# Patient Record
Sex: Female | Born: 1980 | Race: White | Hispanic: No | Marital: Married | Smoking: Never smoker
Health system: Southern US, Community
[De-identification: ages and names within clinical notes are randomized; demographics above are authoritative.]

## PROBLEM LIST (undated history)

## (undated) ENCOUNTER — Inpatient Hospital Stay (HOSPITAL_COMMUNITY): Payer: Self-pay

## (undated) DIAGNOSIS — Z789 Other specified health status: Secondary | ICD-10-CM

## (undated) DIAGNOSIS — J301 Allergic rhinitis due to pollen: Secondary | ICD-10-CM

## (undated) DIAGNOSIS — B019 Varicella without complication: Secondary | ICD-10-CM

## (undated) DIAGNOSIS — K219 Gastro-esophageal reflux disease without esophagitis: Secondary | ICD-10-CM

## (undated) DIAGNOSIS — Z6711 Type A blood, Rh negative: Secondary | ICD-10-CM

## (undated) HISTORY — DX: Varicella without complication: B01.9

## (undated) HISTORY — DX: Gastro-esophageal reflux disease without esophagitis: K21.9

## (undated) HISTORY — PX: MOUTH SURGERY: SHX715

## (undated) HISTORY — DX: Type A blood, Rh negative: Z67.11

## (undated) HISTORY — DX: Allergic rhinitis due to pollen: J30.1

## (undated) HISTORY — PX: LAPAROSCOPY: SHX197

---
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
## 2007-10-29 DIAGNOSIS — B977 Papillomavirus as the cause of diseases classified elsewhere: Secondary | ICD-10-CM

## 2007-10-29 HISTORY — PX: CERVICAL CONE BIOPSY: SUR198

## 2007-10-29 HISTORY — PX: LEEP: SHX91

## 2007-10-29 HISTORY — DX: Papillomavirus as the cause of diseases classified elsewhere: B97.7

## 2011-03-08 ENCOUNTER — Emergency Department (HOSPITAL_COMMUNITY)
Admission: EM | Admit: 2011-03-08 | Discharge: 2011-03-09 | Disposition: A | Discharge status: Home / Self Care | Payer: No Typology Code available for payment source | Attending: Emergency Medicine | Admitting: Emergency Medicine

## 2011-03-08 DIAGNOSIS — M542 Cervicalgia: Secondary | ICD-10-CM | POA: Insufficient documentation

## 2011-03-08 DIAGNOSIS — S139XXA Sprain of joints and ligaments of unspecified parts of neck, initial encounter: Secondary | ICD-10-CM | POA: Insufficient documentation

## 2011-03-08 DIAGNOSIS — S335XXA Sprain of ligaments of lumbar spine, initial encounter: Secondary | ICD-10-CM | POA: Insufficient documentation

## 2013-09-07 LAB — OB RESULTS CONSOLE GC/CHLAMYDIA
Chlamydia: NEGATIVE
Gonorrhea: NEGATIVE

## 2013-09-29 LAB — OB RESULTS CONSOLE ABO/RH: RH TYPE: NEGATIVE

## 2013-09-29 LAB — OB RESULTS CONSOLE RUBELLA ANTIBODY, IGM: Rubella: IMMUNE

## 2013-09-29 LAB — OB RESULTS CONSOLE RPR: RPR: NONREACTIVE

## 2013-09-29 LAB — OB RESULTS CONSOLE HIV ANTIBODY (ROUTINE TESTING): HIV: NONREACTIVE

## 2013-09-29 LAB — OB RESULTS CONSOLE HEPATITIS B SURFACE ANTIGEN: HEP B S AG: NEGATIVE

## 2013-09-29 LAB — OB RESULTS CONSOLE ANTIBODY SCREEN: Antibody Screen: NEGATIVE

## 2013-10-28 NOTE — L&D Delivery Note (Signed)
Delivery Note At 8:11 PM a viable and healthy female was delivered via Vaginal, Spontaneous Delivery (Presentation: Left ; Occiput Anterior).  APGAR: 8, 9; weight pending.   Placenta status: Intact, Spontaneous.  Cord: 3 vessels  Anesthesia: Epidural  Episiotomy: None Lacerations: None Suture Repair: NA Est. Blood Loss (mL): 300  Mom to postpartum.  Baby to Couplet care / Skin to Skin.  Sheri Bray,Sheri Bray. 04/25/2014, 8:53 PM

## 2013-12-23 ENCOUNTER — Inpatient Hospital Stay (HOSPITAL_COMMUNITY)
Admission: AD | Admit: 2013-12-23 | Payer: No Typology Code available for payment source | Source: Ambulatory Visit | Admitting: Obstetrics and Gynecology

## 2014-02-02 ENCOUNTER — Ambulatory Visit (INDEPENDENT_AMBULATORY_CARE_PROVIDER_SITE_OTHER): Payer: Self-pay | Admitting: Pediatrics

## 2014-02-02 DIAGNOSIS — Z7681 Expectant parent(s) prebirth pediatrician visit: Secondary | ICD-10-CM

## 2014-02-02 NOTE — Progress Notes (Signed)
Prenatal visit with expectant parents Met mother and father, expecting girl infant on April 29, 2014 Dr. 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

## 2014-03-31 LAB — OB RESULTS CONSOLE GBS: GBS: NEGATIVE

## 2014-04-21 ENCOUNTER — Inpatient Hospital Stay (HOSPITAL_COMMUNITY)
Admission: AD | Admit: 2014-04-21 | Discharge: 2014-04-21 | Disposition: A | Discharge status: Home / Self Care | Payer: BC Managed Care – PPO | Source: Ambulatory Visit | Attending: Obstetrics and Gynecology | Admitting: Obstetrics and Gynecology

## 2014-04-21 ENCOUNTER — Encounter (HOSPITAL_COMMUNITY): Payer: Self-pay | Admitting: *Deleted

## 2014-04-21 DIAGNOSIS — O9989 Other specified diseases and conditions complicating pregnancy, childbirth and the puerperium: Secondary | ICD-10-CM

## 2014-04-21 DIAGNOSIS — Z87891 Personal history of nicotine dependence: Secondary | ICD-10-CM | POA: Insufficient documentation

## 2014-04-21 DIAGNOSIS — O99891 Other specified diseases and conditions complicating pregnancy: Secondary | ICD-10-CM | POA: Insufficient documentation

## 2014-04-21 DIAGNOSIS — N898 Other specified noninflammatory disorders of vagina: Secondary | ICD-10-CM | POA: Insufficient documentation

## 2014-04-21 DIAGNOSIS — O26899 Other specified pregnancy related conditions, unspecified trimester: Secondary | ICD-10-CM

## 2014-04-21 HISTORY — DX: Other specified health status: Z78.9

## 2014-04-21 NOTE — MAU Note (Signed)
Pt states she noticed discharge today at 1230, pt denies pain at this timie

## 2014-04-21 NOTE — Progress Notes (Signed)
Pt repositioned self maternal heart rate being monitored

## 2014-04-21 NOTE — MAU Provider Note (Signed)
  History     CSN: [id number]  Arrival date and time: 04/21/14 1646   First Provider Initiated Contact with Patient 04/21/14 1730      Chief Complaint  Patient presents with  . Vaginal Discharge   Vaginal Discharge The patient's primary symptoms include a vaginal discharge. Pertinent negatives include no abdominal pain.    Pt is a 33 yo G2P0 at 1951w6d wks IUP here with report of passing thick, greenish mucus from vagina.  Denies vaginal bleeding or contractions.  +fetal movement.    Past Medical History  Diagnosis Date  . Medical history non-contributory     Past Surgical History  Procedure Laterality Date  . Mouth surgery    . Tonsillectomy    . Laparoscopy      No family history on file.  History  Substance Use Topics  . Smoking status: Not on file  . Smokeless tobacco: Not on file  . Alcohol Use: Not on file    Allergies:  Allergies  Allergen Reactions  . Sulfa Antibiotics Other (See Comments)    Unknown childhood reaction.    Prescriptions prior to admission  Medication Sig Dispense Refill  . Ca Carbonate-Mag Hydroxide (ROLAIDS PO) Take 1-2 tablets by mouth daily as needed (For heartburn.).      Marland Kitchen. fluticasone (FLONASE) 50 MCG/ACT nasal spray Place 2 sprays into both nostrils at bedtime.      . Prenatal Vit-Fe Fumarate-FA (PRENATAL MULTIVITAMIN) TABS tablet Take 1 tablet by mouth at bedtime.        Review of Systems  Gastrointestinal: Negative for abdominal pain.  Genitourinary: Positive for vaginal discharge.       Vaginal discharge (mucus)  All other systems reviewed and are negative.  Physical Exam   Filed Vitals:   04/21/14 1843  BP: 101/68  Pulse: 80  Resp: 18     Physical Exam  Constitutional: She is oriented to person, place, and time. She appears well-developed and well-nourished. No distress.  HENT:  Head: Normocephalic.  Neck: Normal range of motion. Neck supple.  Cardiovascular: Normal rate, regular rhythm and normal heart sounds.    Respiratory: Effort normal and breath sounds normal.  GI: Soft. There is no tenderness.  Genitourinary: No bleeding around the vagina. Vaginal discharge (thick, yellow mucus like discharge) found.  Neurological: She is alert and oriented to person, place, and time.  Skin: Skin is warm and dry.   Dilation: 5 Effacement (%): 50 Cervical Position: Middle Station: -2 Presentation: Vertex Exam by:: Roney MarionW. Muhammad, CNM (same as in office)  FHR 120's, +accels Toco none MAU Course  Procedures Consulted with Dr. Henderson CloudHorvath > reviewed HPI/exam/cervical exam/NST>discharge to home Assessment and Plan  33 yo G2P0 at 3251w6d wks IUP Category I FHR Tracing Mucus Plug  Plan: Discharge to home Labor precautions Induction of Labor on Monday per patient   [facility] CenterMUHAMMAD,WALIDAH 04/21/2014, 6:12 PM

## 2014-04-21 NOTE — MAU Note (Signed)
Patient states she has had a greenish discharge. Denies contractions. Has been 5 cm in the office. Reports good fetal movement.

## 2014-04-25 ENCOUNTER — Inpatient Hospital Stay (HOSPITAL_COMMUNITY)
Admission: AD | Admit: 2014-04-25 | Discharge: 2014-04-27 | DRG: 775 | Disposition: A | Discharge status: Home / Self Care | Payer: BC Managed Care – PPO | Source: Ambulatory Visit | Attending: Obstetrics and Gynecology | Admitting: Obstetrics and Gynecology

## 2014-04-25 ENCOUNTER — Encounter (HOSPITAL_COMMUNITY): Payer: BC Managed Care – PPO | Admitting: Anesthesiology

## 2014-04-25 ENCOUNTER — Inpatient Hospital Stay (HOSPITAL_COMMUNITY): Payer: BC Managed Care – PPO | Admitting: Anesthesiology

## 2014-04-25 ENCOUNTER — Encounter (HOSPITAL_COMMUNITY): Payer: Self-pay | Admitting: *Deleted

## 2014-04-25 DIAGNOSIS — O411090 Infection of amniotic sac and membranes, unspecified, unspecified trimester, not applicable or unspecified: Principal | ICD-10-CM | POA: Diagnosis present

## 2014-04-25 LAB — RPR

## 2014-04-25 LAB — CBC
HEMATOCRIT: 34.4 % — AB (ref 36.0–46.0)
Hemoglobin: 12.2 g/dL (ref 12.0–15.0)
MCH: 30.4 pg (ref 26.0–34.0)
MCHC: 35.5 g/dL (ref 30.0–36.0)
MCV: 85.8 fL (ref 78.0–100.0)
Platelets: 243 10*3/uL (ref 150–400)
RBC: 4.01 MIL/uL (ref 3.87–5.11)
RDW: 13.1 % (ref 11.5–15.5)
WBC: 11.4 10*3/uL — ABNORMAL HIGH (ref 4.0–10.5)

## 2014-04-25 LAB — SAMPLE TO BLOOD BANK

## 2014-04-25 MED ORDER — ONDANSETRON HCL 4 MG PO TABS: 4.0000 mg | ORAL_TABLET | ORAL | Status: DC | PRN

## 2014-04-25 MED ORDER — OXYTOCIN 40 UNITS IN LACTATED RINGERS INFUSION - SIMPLE MED: 62.5000 mL/h | INTRAVENOUS | Status: DC

## 2014-04-25 MED ORDER — LANOLIN HYDROUS EX OINT
TOPICAL_OINTMENT | CUTANEOUS | Status: DC | PRN
Start: 2014-04-25 — End: 2014-04-27

## 2014-04-25 MED ORDER — EPHEDRINE 5 MG/ML INJ
INTRAVENOUS | Status: AC
  Filled 2014-04-25: qty 4

## 2014-04-25 MED ORDER — LIDOCAINE HCL (PF) 1 % IJ SOLN
30.0000 mL | INTRAMUSCULAR | Status: DC | PRN
  Filled 2014-04-25: qty 30

## 2014-04-25 MED ORDER — ZOLPIDEM TARTRATE 5 MG PO TABS: 5.0000 mg | ORAL_TABLET | Freq: Every evening | ORAL | Status: DC | PRN

## 2014-04-25 MED ORDER — PRENATAL MULTIVITAMIN CH
1.0000 | ORAL_TABLET | Freq: Every day | ORAL | Status: DC
  Administered 2014-04-26 – 2014-04-27 (×2): 1 via ORAL
  Filled 2014-04-25 (×2): qty 1

## 2014-04-25 MED ORDER — BENZOCAINE-MENTHOL 20-0.5 % EX AERO
1.0000 "application " | INHALATION_SPRAY | CUTANEOUS | Status: DC | PRN
  Administered 2014-04-26: 1 via TOPICAL
  Filled 2014-04-25: qty 56

## 2014-04-25 MED ORDER — ONDANSETRON HCL 4 MG/2ML IJ SOLN
4.0000 mg | INTRAMUSCULAR | Status: DC | PRN
Start: 2014-04-25 — End: 2014-04-27

## 2014-04-25 MED ORDER — ACETAMINOPHEN 325 MG PO TABS: 650.0000 mg | ORAL_TABLET | ORAL | Status: DC | PRN

## 2014-04-25 MED ORDER — EPHEDRINE 5 MG/ML INJ
10.0000 mg | INTRAVENOUS | Status: DC | PRN
  Filled 2014-04-25: qty 2

## 2014-04-25 MED ORDER — LACTATED RINGERS IV SOLN: 500.0000 mL | Freq: Once | INTRAVENOUS | Status: DC

## 2014-04-25 MED ORDER — FENTANYL 2.5 MCG/ML BUPIVACAINE 1/10 % EPIDURAL INFUSION (WH - ANES)
14.0000 mL/h | INTRAMUSCULAR | Status: DC | PRN
  Administered 2014-04-25: 14 mL/h via EPIDURAL

## 2014-04-25 MED ORDER — PHENYLEPHRINE 40 MCG/ML (10ML) SYRINGE FOR IV PUSH (FOR BLOOD PRESSURE SUPPORT)
80.0000 ug | PREFILLED_SYRINGE | INTRAVENOUS | Status: DC | PRN
  Filled 2014-04-25: qty 2

## 2014-04-25 MED ORDER — OXYCODONE-ACETAMINOPHEN 5-325 MG PO TABS
1.0000 | ORAL_TABLET | ORAL | Status: DC | PRN
Start: 2014-04-25 — End: 2014-04-27

## 2014-04-25 MED ORDER — SIMETHICONE 80 MG PO CHEW
80.0000 mg | CHEWABLE_TABLET | ORAL | Status: DC | PRN
  Filled 2014-04-25: qty 1

## 2014-04-25 MED ORDER — FLUTICASONE PROPIONATE 50 MCG/ACT NA SUSP
2.0000 | Freq: Every day | NASAL | Status: DC
  Administered 2014-04-26: 2 via NASAL
  Filled 2014-04-25: qty 16

## 2014-04-25 MED ORDER — SENNOSIDES-DOCUSATE SODIUM 8.6-50 MG PO TABS
2.0000 | ORAL_TABLET | ORAL | Status: DC
  Administered 2014-04-26: 2 via ORAL
  Filled 2014-04-25 (×2): qty 2

## 2014-04-25 MED ORDER — DIBUCAINE 1 % RE OINT: 1.0000 "application " | TOPICAL_OINTMENT | RECTAL | Status: DC | PRN

## 2014-04-25 MED ORDER — BUTORPHANOL TARTRATE 1 MG/ML IJ SOLN: 1.0000 mg | INTRAMUSCULAR | Status: DC | PRN

## 2014-04-25 MED ORDER — LACTATED RINGERS IV SOLN
INTRAVENOUS | Status: DC
Start: 2014-04-25 — End: 2014-04-25
  Administered 2014-04-25 (×2): via INTRAVENOUS

## 2014-04-25 MED ORDER — OXYCODONE-ACETAMINOPHEN 5-325 MG PO TABS: 1.0000 | ORAL_TABLET | ORAL | Status: DC | PRN

## 2014-04-25 MED ORDER — IBUPROFEN 600 MG PO TABS
600.0000 mg | ORAL_TABLET | Freq: Four times a day (QID) | ORAL | Status: DC
  Administered 2014-04-26 – 2014-04-27 (×7): 600 mg via ORAL
  Filled 2014-04-25 (×7): qty 1

## 2014-04-25 MED ORDER — WITCH HAZEL-GLYCERIN EX PADS: 1.0000 "application " | MEDICATED_PAD | CUTANEOUS | Status: DC | PRN

## 2014-04-25 MED ORDER — DIPHENHYDRAMINE HCL 25 MG PO CAPS
25.0000 mg | ORAL_CAPSULE | Freq: Four times a day (QID) | ORAL | Status: DC | PRN
  Administered 2014-04-25: 25 mg via ORAL
  Filled 2014-04-25: qty 1

## 2014-04-25 MED ORDER — FENTANYL 2.5 MCG/ML BUPIVACAINE 1/10 % EPIDURAL INFUSION (WH - ANES)
INTRAMUSCULAR | Status: AC
  Filled 2014-04-25: qty 125

## 2014-04-25 MED ORDER — PHENYLEPHRINE 40 MCG/ML (10ML) SYRINGE FOR IV PUSH (FOR BLOOD PRESSURE SUPPORT)
PREFILLED_SYRINGE | INTRAVENOUS | Status: AC
  Filled 2014-04-25: qty 10

## 2014-04-25 MED ORDER — OXYTOCIN BOLUS FROM INFUSION
500.0000 mL | INTRAVENOUS | Status: DC
  Administered 2014-04-25: 500 mL via INTRAVENOUS

## 2014-04-25 MED ORDER — FLEET ENEMA 7-19 GM/118ML RE ENEM: 1.0000 | ENEMA | RECTAL | Status: DC | PRN

## 2014-04-25 MED ORDER — TERBUTALINE SULFATE 1 MG/ML IJ SOLN: 0.2500 mg | Freq: Once | INTRAMUSCULAR | Status: DC | PRN

## 2014-04-25 MED ORDER — ONDANSETRON HCL 4 MG/2ML IJ SOLN: 4.0000 mg | Freq: Four times a day (QID) | INTRAMUSCULAR | Status: DC | PRN

## 2014-04-25 MED ORDER — TETANUS-DIPHTH-ACELL PERTUSSIS 5-2.5-18.5 LF-MCG/0.5 IM SUSP
0.5000 mL | Freq: Once | INTRAMUSCULAR | Status: AC
  Administered 2014-04-26: 0.5 mL via INTRAMUSCULAR
  Filled 2014-04-25 (×2): qty 0.5

## 2014-04-25 MED ORDER — DIPHENHYDRAMINE HCL 50 MG/ML IJ SOLN: 12.5000 mg | INTRAMUSCULAR | Status: DC | PRN

## 2014-04-25 MED ORDER — IBUPROFEN 600 MG PO TABS: 600.0000 mg | ORAL_TABLET | Freq: Four times a day (QID) | ORAL | Status: DC | PRN

## 2014-04-25 MED ORDER — CITRIC ACID-SODIUM CITRATE 334-500 MG/5ML PO SOLN: 30.0000 mL | ORAL | Status: DC | PRN

## 2014-04-25 MED ORDER — OXYTOCIN 40 UNITS IN LACTATED RINGERS INFUSION - SIMPLE MED
1.0000 m[IU]/min | INTRAVENOUS | Status: DC
  Administered 2014-04-25: 2 m[IU]/min via INTRAVENOUS
  Filled 2014-04-25: qty 1000

## 2014-04-25 MED ORDER — LIDOCAINE HCL (PF) 1 % IJ SOLN
INTRAMUSCULAR | Status: DC | PRN
  Administered 2014-04-25 (×2): 5 mL

## 2014-04-25 MED ORDER — LACTATED RINGERS IV SOLN: 500.0000 mL | INTRAVENOUS | Status: DC | PRN

## 2014-04-25 NOTE — Anesthesia Preprocedure Evaluation (Signed)

## 2014-04-25 NOTE — Anesthesia Procedure Notes (Signed)
Epidural Patient location during procedure: OB Start time: 04/25/2014 3:17 PM  Staffing Anesthesiologist: Brayton CavesJACKSON, FREEMAN Performed by: anesthesiologist   Preanesthetic Checklist Completed: patient identified, site marked, surgical consent, pre-op evaluation, timeout performed, IV checked, risks and benefits discussed and monitors and equipment checked  Epidural Patient position: sitting Prep: site prepped and draped and DuraPrep Patient monitoring: continuous pulse ox and blood pressure Approach: midline Location: L3-L4 Injection technique: LOR air  Needle:  Needle type: Tuohy  Needle gauge: 17 G Needle length: 9 cm and 9 Needle insertion depth: 5 cm cm Catheter type: closed end flexible Catheter size: 19 Gauge Catheter at skin depth: 10 cm Test dose: negative  Assessment Events: blood not aspirated, injection not painful, no injection resistance, negative IV test and no paresthesia  Additional Notes Patient identified.  Risk benefits discussed including failed block, incomplete pain control, headache, nerve damage, paralysis, blood pressure changes, nausea, vomiting, reactions to medication both toxic or allergic, and postpartum back pain.  Patient expressed understanding and wished to proceed.  All questions were answered.  Sterile technique used throughout procedure and epidural site dressed with sterile barrier dressing. No paresthesia or other complications noted.The patient did not experience any signs of intravascular injection such as tinnitus or metallic taste in mouth nor signs of intrathecal spread such as rapid motor block. Please see nursing notes for vital signs.

## 2014-04-26 LAB — CBC
HCT: 33.4 % — ABNORMAL LOW (ref 36.0–46.0)
Hemoglobin: 11.8 g/dL — ABNORMAL LOW (ref 12.0–15.0)
MCH: 30.4 pg (ref 26.0–34.0)
MCHC: 35.3 g/dL (ref 30.0–36.0)
MCV: 86.1 fL (ref 78.0–100.0)
PLATELETS: 237 10*3/uL (ref 150–400)
RBC: 3.88 MIL/uL (ref 3.87–5.11)
RDW: 13.2 % (ref 11.5–15.5)
WBC: 17.6 10*3/uL — ABNORMAL HIGH (ref 4.0–10.5)

## 2014-04-26 LAB — ABO/RH: ABO/RH(D): A NEG

## 2014-04-26 LAB — CCBB MATERNAL DONOR DRAW

## 2014-04-26 MED ORDER — RHO D IMMUNE GLOBULIN 1500 UNIT/2ML IJ SOSY
300.0000 ug | PREFILLED_SYRINGE | Freq: Once | INTRAMUSCULAR | Status: AC
  Administered 2014-04-26: 300 ug via INTRAVENOUS
  Filled 2014-04-26: qty 2

## 2014-04-26 NOTE — H&P (Signed)
Sheri Bray is a 33 y.o. female presenting for IOL for advanced cervical dilation  33 yo G1P0 @ 39+3 presnets for IOL for advanced cervical dilation. Patient's pregnancy has been uncomplicated to this point History OB History   Grav Para Term Preterm Abortions TAB SAB Ect Mult Living   1 1 1       1      Past Medical History  Diagnosis Date  . Medical history non-contributory    Past Surgical History  Procedure Laterality Date  . Mouth surgery    . Tonsillectomy    . Laparoscopy    . Leep  2009  . Cervical cone biopsy  2009   Family History: family history is not on file. Social History:  reports that she has never smoked. She does not have any smokeless tobacco history on file. Her alcohol and drug histories are not on file.   Prenatal Transfer Tool  Maternal Diabetes: No Genetic Screening: Normal Maternal Ultrasounds/Referrals: Normal Fetal Ultrasounds or other Referrals:  None Maternal Substance Abuse:  No Significant Maternal Medications:  None Significant Maternal Lab Results:  None Other Comments:  None  ROS  Dilation: 10 Effacement (%): 100 Station: +2 Exam by:: Dr. Tenny Crawoss Blood pressure 105/70, pulse 82, temperature 98.7 F (37.1 C), temperature source Oral, resp. rate 18, height 5\' 3"  (1.6 m), weight 65.772 kg (145 lb), SpO2 97.00%, unknown if currently breastfeeding. Exam Physical Exam  Prenatal labs: ABO, Rh: --/--/A NEG (06/29 0815) Antibody: Negative (12/03 0000) Rubella: Immune (12/03 0000) RPR: NON REAC (06/29 0815)  HBsAg: Negative (12/03 0000)  HIV: Non-reactive (12/03 0000)  GBS: Negative (06/04 0000)   Assessment/Plan: 1) Admit 2) AROM 3) Epidural on request   ROSS,KENDRA H. 04/26/2014, 12:26 AM

## 2014-04-26 NOTE — Lactation Note (Signed)
This note was copied from the chart of Girl Creta Levinmanda Centola. Lactation Consultation Note Follow up visit at 22 hours.  Mom requesting assistance with latching.  Mom reports baby was trying to latch but was fussy.  Discussed burping and newborn behavior.  Assistance needed to assist mom with football hold latch.  Reviewed breastfeeding basics and positioning. Baby maintained a latch for about 10 minutes with stimulation needed to continue sucking.  Few swallows observed at this feeding.  Mom denies pain although tongue is heart shaped and indents when baby attempt to extend past lower gum line.  Encouraged mom to continue latch attempts and call as needed for assist.    Patient Name: Girl Creta Levinmanda Fennell ZOXWR'UToday's Date: 04/26/2014 Reason for consult: Follow-up assessment   Maternal Data Has patient been taught Hand Expression?: Yes Does the patient have breastfeeding experience prior to this delivery?: No  Feeding Feeding Type: Breast Fed Length of feed: 10 min  LATCH Score/Interventions Latch: Repeated attempts needed to sustain latch, nipple held in mouth throughout feeding, stimulation needed to elicit sucking reflex. Intervention(s): Teach feeding cues;Waking techniques;Skin to skin Intervention(s): Adjust position;Assist with latch  Audible Swallowing: A few with stimulation  Type of Nipple: Everted at rest and after stimulation  Comfort (Breast/Nipple): Soft / non-tender     Hold (Positioning): No assistance needed to correctly position infant at breast. Intervention(s): Breastfeeding basics reviewed;Support Pillows;Position options;Skin to skin  LATCH Score: 8  Lactation Tools Discussed/Used     Consult Status Consult Status: Follow-up Date: 04/27/14 Follow-up type: In-patient    Beverely RisenShoptaw, Arvella MerlesJana Lynn 04/26/2014, 6:35 PM

## 2014-04-26 NOTE — Lactation Note (Signed)
This note was copied from the chart of Girl Creta Levinmanda Hoagland. Lactation Consultation Note  Patient Name: Girl Creta Levinmanda Fernandez WUJWJ'XToday's Date: 04/26/2014 Reason for consult: Initial assessment LC assisted Mom with positioning and latching baby. Mom has flat nipples but very compressible. LC notes baby has short, anterior frenulum with dimpling in the end of the tongue. On suck exam, biting, chewing noted. Demonstrated hand expression to Mom, colostrum present. With [facility] demonstrating breast compression baby was able to latch and demonstrated a good suckling rhythm with stimulation. At the end of the feeding, slight nipple compression noted. Mom denied any discomfort with baby at the breast. Basic teaching reviewed with parents. Cluster feeding reviewed. Lactation brochure left for review, advised of OP services and support group. Advised parents it is too early to assess if frenulum will affect feeding. Advised to have Peds evaluate at the next visit. Continue to monitor voids/stool, nipple tenderness, weight loss and milk transfer. Call for assist as needed with latch.   Maternal Data Formula Feeding for Exclusion: No Infant to breast within first hour of birth: Yes Has patient been taught Hand Expression?: Yes Does the patient have breastfeeding experience prior to this delivery?: No  Feeding Feeding Type: Breast Fed Length of feed: 20 min  LATCH Score/Interventions Latch: Grasps breast easily, tongue down, lips flanged, rhythmical sucking. (using breast compression) Intervention(s): Adjust position;Assist with latch;Breast massage;Breast compression  Audible Swallowing: A few with stimulation  Type of Nipple: Flat Intervention(s): Hand pump  Comfort (Breast/Nipple): Soft / non-tender     Hold (Positioning): Assistance needed to correctly position infant at breast and maintain latch. Intervention(s): Breastfeeding basics reviewed;Support Pillows;Position options;Skin to skin  LATCH Score:  7  Lactation Tools Discussed/Used Tools: Pump Breast pump type: Manual WIC Program: No   Consult Status Consult Status: Follow-up Date: 04/27/14 Follow-up type: In-patient    Alfred LevinsGranger, Kathy Ann 04/26/2014, 2:55 PM

## 2014-04-26 NOTE — Anesthesia Postprocedure Evaluation (Signed)
Anesthesia Post Note  Patient: Sheri Bray  Procedure(s) Performed: * No procedures listed *  Anesthesia type: Epidural  Patient location: Mother/Baby  Post pain: Pain level controlled  Post assessment: Post-op Vital signs reviewed  Last Vitals:  Filed Vitals:   04/26/14 0555  BP: 93/63  Pulse: 79  Temp: 36.7 C  Resp: 18    Post vital signs: Reviewed  Level of consciousness:alert  Complications: No apparent anesthesia complications

## 2014-04-26 NOTE — Progress Notes (Signed)
PPD #1 Patient is eating, ambulating, voiding.  Pain control is good.  No complaints.  Prefers to stay until tomorrow.  Filed Vitals:   04/25/14 2230 04/26/14 0015 04/26/14 0415 04/26/14 0555  BP: 105/70 95/63 96/64  93/63  Pulse: 82 74 85 79  Temp: 98.7 F (37.1 C) 98 F (36.7 C) 98.4 F (36.9 C) 98 F (36.7 C)  TempSrc: Oral Oral Oral Oral  Resp: 18 18 18 18   Height:      Weight:      SpO2: 97%       Fundus firm Perineum without swelling.  No CT  Lab Results  Component Value Date   WBC 17.6* 04/26/2014   HGB 11.8* 04/26/2014   HCT 33.4* 04/26/2014   MCV 86.1 04/26/2014   PLT 237 04/26/2014    --/--/A NEG (06/30 0615)  A/P Post partum day 1.  Routine care.  Expect d/c 7/1.    Philip AspenALLAHAN, SIDNEY

## 2014-04-27 LAB — RH IG WORKUP (INCLUDES ABO/RH)
ABO/RH(D): A NEG
Antibody Screen: POSITIVE
DAT, IGG: NEGATIVE
Fetal Screen: NEGATIVE
Gestational Age(Wks): 39.3
Unit division: 0

## 2014-04-27 MED ORDER — OXYCODONE-ACETAMINOPHEN 5-325 MG PO TABS: 1.0000 | ORAL_TABLET | ORAL | Status: DC | PRN

## 2014-04-27 NOTE — Progress Notes (Signed)
PPD#2 Pt doing well. Lochia -wnl. IMP/ Doing well Plan/ Will discharge to home.

## 2014-04-27 NOTE — Discharge Summary (Signed)
Obstetric Discharge Summary Reason for Admission: induction of labor Prenatal Procedures: ultrasound Intrapartum Procedures: spontaneous vaginal delivery Postpartum Procedures: none Complications-Operative and Postpartum: none Hemoglobin  Date Value Ref Range Status  04/26/2014 11.8* 12.0 - 15.0 g/dL Final     HCT  Date Value Ref Range Status  04/26/2014 33.4* 36.0 - 46.0 % Final    Physical Exam:  General: alert Lochia: appropriate Uterine Fundus: firm   Discharge Diagnoses: Term Pregnancy-delivered  Discharge Information: Date: 04/27/2014 Activity: pelvic rest Diet: routine Medications: PNV and Percocet Condition: stable Instructions: refer to practice specific booklet Discharge to: home Follow-up Information   Follow up with Almon HerculesOSS,KENDRA H., MD. Schedule an appointment as soon as possible for a visit in 1 month.   Specialty:  Obstetrics and Gynecology   Contact information:   [address] [place] [id number] [phone]       Newborn Data: Live born female  Birth Weight: 6 lb 13.9 oz (3116 g) APGAR: 8, 9  Home with mother.  Manolo Bosket E 04/27/2014, 8:45 AM

## 2014-05-04 ENCOUNTER — Ambulatory Visit (HOSPITAL_COMMUNITY): Payer: BC Managed Care – PPO

## 2014-05-23 ENCOUNTER — Other Ambulatory Visit: Payer: Self-pay | Admitting: Obstetrics and Gynecology

## 2014-05-24 LAB — CYTOLOGY - PAP

## 2014-08-29 ENCOUNTER — Encounter (HOSPITAL_COMMUNITY): Payer: Self-pay | Admitting: *Deleted

## 2015-10-29 NOTE — L&D Delivery Note (Signed)
Delivery Note  First Stage: Labor onset: 0200 Augmentation : AROM Analgesia /Anesthesia intrapartum: nitrous x 30 minutes AROM at 0445 - light yellow MSF  Requested epidural - up to bathroom to labor in toilet awaiting for tub to drain prior to anesthesia call Urge to push after 20 minutes in bathroom - pushing to +3 on toilet until assisted back to bed into hands-knees to complete active second stage  Second Stage: Complete dilation at 0614 Onset of pushing at 0620 intermittently involuntarily pushing with most ctx FHR second stage 2  Crowning x 3 ctx - delivery of head between ctx  (0739) no nuchal cord Flipped from han([id number]-knees to side lying for delivery of shoulders and body - delivered on 1st attempt in LOA position Tight fit down to hips but no difficulty with delivery  No shoulder dystocia (nurse documented dystocia) Position change to allow CNM assist since no maternal effort  Delivery of a viable female at [id number] by CNM   Cord double clamped after cessation of pulsation, cut by FOB Cord blood sample collected   Third Stage: [facility] 3 VC @ 0749 Placenta disposition: home with patient Uterine tone massaged to firm with initial atony / bleeding moderate Pitocin 10 units IM  no laceration identified  Est. Blood Loss (mL): 350  Additional episode of bleeding - clots expressed / LUS boggy with firm fundus. Cytotec 800 mcg PR given. Additional 200ml blood loss. VS stable.  Complications: none  Mom to postpartum.  Baby to Couplet care / Skin to Skin.  Newborn: Birth Weight: 11 pounds 5 oz Apgar Scores: 8-9 Feeding planned: breast  Marlinda MikeBAILEY, Hutch Rhett CNM, MSN, FACNM 09/22/2016, 7:58 AM

## 2016-01-31 ENCOUNTER — Other Ambulatory Visit: Payer: Self-pay | Admitting: Obstetrics and Gynecology

## 2016-01-31 LAB — OB RESULTS CONSOLE GC/CHLAMYDIA
CHLAMYDIA, DNA PROBE: NEGATIVE
GC PROBE AMP, GENITAL: NEGATIVE

## 2016-02-02 LAB — CYTOLOGY - PAP

## 2016-07-19 ENCOUNTER — Encounter (HOSPITAL_COMMUNITY): Payer: Self-pay

## 2016-07-19 ENCOUNTER — Inpatient Hospital Stay (HOSPITAL_COMMUNITY)
Admission: AD | Admit: 2016-07-19 | Discharge: 2016-07-20 | DRG: 778 | Disposition: A | Discharge status: Home / Self Care | Payer: BLUE CROSS/BLUE SHIELD | Source: Ambulatory Visit | Attending: Obstetrics | Admitting: Obstetrics

## 2016-07-19 ENCOUNTER — Inpatient Hospital Stay (HOSPITAL_COMMUNITY): Payer: BLUE CROSS/BLUE SHIELD

## 2016-07-19 DIAGNOSIS — O4703 False labor before 37 completed weeks of gestation, third trimester: Secondary | ICD-10-CM | POA: Diagnosis present

## 2016-07-19 DIAGNOSIS — Z3A31 31 weeks gestation of pregnancy: Secondary | ICD-10-CM

## 2016-07-19 LAB — URINALYSIS, ROUTINE W REFLEX MICROSCOPIC
Bilirubin Urine: NEGATIVE
GLUCOSE, UA: NEGATIVE mg/dL
Hgb urine dipstick: NEGATIVE
Ketones, ur: 15 mg/dL — AB
LEUKOCYTES UA: NEGATIVE
NITRITE: NEGATIVE
PH: 5 (ref 5.0–8.0)
Protein, ur: NEGATIVE mg/dL
SPECIFIC GRAVITY, URINE: 1.02 (ref 1.005–1.030)

## 2016-07-19 LAB — CBC
HCT: 35 % — ABNORMAL LOW (ref 36.0–46.0)
HEMOGLOBIN: 12.3 g/dL (ref 12.0–15.0)
MCH: 30.2 pg (ref 26.0–34.0)
MCHC: 35.1 g/dL (ref 30.0–36.0)
MCV: 86 fL (ref 78.0–100.0)
Platelets: 265 10*3/uL (ref 150–400)
RBC: 4.07 MIL/uL (ref 3.87–5.11)
RDW: 13.7 % (ref 11.5–15.5)
WBC: 11 10*3/uL — ABNORMAL HIGH (ref 4.0–10.5)

## 2016-07-19 LAB — TYPE AND SCREEN
ABO/RH(D): A NEG
ANTIBODY SCREEN: NEGATIVE

## 2016-07-19 LAB — OB RESULTS CONSOLE RUBELLA ANTIBODY, IGM: RUBELLA: IMMUNE

## 2016-07-19 LAB — GROUP B STREP BY PCR: Group B strep by PCR: NEGATIVE

## 2016-07-19 LAB — OB RESULTS CONSOLE ABO/RH: RH Type: NEGATIVE

## 2016-07-19 LAB — OB RESULTS CONSOLE RPR: RPR: NONREACTIVE

## 2016-07-19 LAB — OB RESULTS CONSOLE HEPATITIS B SURFACE ANTIGEN: Hepatitis B Surface Ag: NEGATIVE

## 2016-07-19 LAB — OB RESULTS CONSOLE HIV ANTIBODY (ROUTINE TESTING): HIV: NONREACTIVE

## 2016-07-19 MED ORDER — LACTATED RINGERS IV SOLN
INTRAVENOUS | Status: DC
  Administered 2016-07-19 – 2016-07-20 (×3): via INTRAVENOUS

## 2016-07-19 MED ORDER — MAGNESIUM SULFATE 50 % IJ SOLN
2.0000 g/h | INTRAVENOUS | Status: DC
  Administered 2016-07-20: 2 g/h via INTRAVENOUS
  Filled 2016-07-19 (×2): qty 80

## 2016-07-19 MED ORDER — PRENATAL MULTIVITAMIN CH
1.0000 | ORAL_TABLET | Freq: Every day | ORAL | Status: DC
  Administered 2016-07-19: 1 via ORAL
  Filled 2016-07-19: qty 1

## 2016-07-19 MED ORDER — DOCUSATE SODIUM 100 MG PO CAPS: 100.0000 mg | ORAL_CAPSULE | Freq: Every day | ORAL | Status: DC

## 2016-07-19 MED ORDER — PRENATAL MULTIVITAMIN CH: 1.0000 | ORAL_TABLET | Freq: Every day | ORAL | Status: DC

## 2016-07-19 MED ORDER — BETAMETHASONE SOD PHOS & ACET 6 (3-3) MG/ML IJ SUSP
12.0000 mg | INTRAMUSCULAR | Status: AC
  Administered 2016-07-19 – 2016-07-20 (×2): 12 mg via INTRAMUSCULAR
  Filled 2016-07-19 (×2): qty 2

## 2016-07-19 MED ORDER — ACETAMINOPHEN 325 MG PO TABS: 650.0000 mg | ORAL_TABLET | ORAL | Status: DC | PRN

## 2016-07-19 MED ORDER — CALCIUM CARBONATE ANTACID 500 MG PO CHEW: 2.0000 | CHEWABLE_TABLET | ORAL | Status: DC | PRN

## 2016-07-19 MED ORDER — MAGNESIUM SULFATE BOLUS VIA INFUSION
4.0000 g | Freq: Once | INTRAVENOUS | Status: AC
  Administered 2016-07-19: 4 g via INTRAVENOUS
  Filled 2016-07-19: qty 500

## 2016-07-19 MED ORDER — ZOLPIDEM TARTRATE 5 MG PO TABS
5.0000 mg | ORAL_TABLET | Freq: Every evening | ORAL | Status: DC | PRN
  Filled 2016-07-19: qty 1

## 2016-07-19 NOTE — H&P (Signed)
Sheri Bray is a 35 y.o. G2P1001 at 31'5 presenting for preterm cervical change. Pt notes no contractions though she does note several episodes over the last week with sharp abdominal or back pains . Good fetal movement, No vaginal bleeding, not leaking fluid.  Patient has a history of early cervical dilation as a G1. Patient states no cervical evaluation until 36 weeks when she was 1 cm and she progressed weekly with advancing dilation and effacement until at 38 weeks she was 5 cm dilated. At that time she had an elective induction of labor. In this pregnancy she has not had any cervical evaluation since her first obstetric visit and she did not have cervical length screening done. Patient does have a history of LEEP.  She presented today for transfer of obstetrical care and her cervix was found to be 3 cm dilated and 80% effaced.  [facility] - Late transfer of care. Patient has had appropriate care from [facility] until today - Cervical dilation noted today as above. History of term vaginal delivery. History of LEEP. This was before her last delivery.  - Rh-. Status post Rhogam - Status post Tdap and flu vaccine   Prenatal Transfer Tool  Maternal Diabetes: No Genetic Screening: Normal Maternal Ultrasounds/Referrals: Normal Fetal Ultrasounds or other Referrals:  None Maternal Substance Abuse:  No Significant Maternal Medications:  None Significant Maternal Lab Results: None     OB History    Gravida Para Term Preterm AB Living   2 1 1     1    SAB TAB Ectopic Multiple Live Births           1     Past Medical History:  Diagnosis Date  . Medical history non-contributory    Past Surgical History:  Procedure Laterality Date  . CERVICAL CONE BIOPSY  2009  . LAPAROSCOPY    . LEEP  2009  . MOUTH SURGERY    . TONSILLECTOMY     Family History: family history includes Arthritis in her mother; Asthma in her mother; Cancer in her maternal  grandfather and paternal grandfather; Depression in her maternal grandmother and mother; Diabetes in her mother, paternal grandfather, and paternal grandmother; Hypertension in her father; Stroke in her maternal grandmother. Social History:  reports that she has never smoked. She has never used smokeless tobacco. She reports that she does not drink alcohol or use drugs.  Review of Systems - Negative except Occasional back and abdominal pain\   Dilation: 3 Effacement (%): 70 Station: -3 Exam by:: Carloyn Jaeger. Dawson, CNM (in ofc) Blood pressure 103/61, pulse 90, temperature 98.3 F (36.8 C), temperature source Oral, resp. rate 18, height 5\' 3"  (1.6 m), weight 73 kg (161 lb), unknown if currently breastfeeding.  Physical Exam:  Gen: well appearing, no distress  Back: no CVAT Abd: gravid, NT, no RUQ pain LE: No edema, equal bilaterally, non-tender, SCDs in place Toco: None FH: baseline 135, accelerations present, no deceleratons, 10 beat variability  Repeat exam by Jewell Ryans at 5 PM: 3 cm/80% effaced/vertex -3 and ballotable/intact/soft consistency/mid position/bulging lower segment but no tension on amniotic sac  Prenatal labs: ABO, Rh: --/--/A NEG (09/22 1300) Antibody: NEG (09/22 1300) Rubella: !Error! Immune RPR:   nonreactive HBsAg:   negative HIV:   negative GBS:   will draw now 1 hr Glucola normal per patient  Genetic screening normal panorama Anatomy US normal per patient   Assessment/Plan: 35 y.o. G2P1001 at 31'5 admitted for  further evaluation of preterm cervical change.  Fetal well-being. Reactive NST though risks due to prematurity. Will give 24 hours of magnesium sulfate and patient is been started on betamethasone. Will do second dose tomorrow plan NICU consult.  Preterm cervical change. Patient with risk factors of early cervical change and last pregnancy and LEEP though patient did have an eventual term delivery. Unclear what her rate of change is. Patient has not made any  cervical change since her exam midmorning. Patient is not showing contractions on the monitor. Should she develop contractions or cervical change would start tocolytics. Will admit for evaluation over the next few days. If cervix remained stable at the conclusion of betamethasone will plan discharge with pelvic rest and possibly modified bedrest and weekly visits for cervical evaluation.   Yandiel Bergum A. 07/19/2016, 4:55 PM

## 2016-07-19 NOTE — Consult Note (Signed)
Neonatology Consult to Antenatal Patient:  I was asked by Dr. Ernestina PennaFogleman to see this patient in order to provide antenatal counseling due to onset of preterm labor.  Ms. Sheri Bray was admitted today at 6831 5/[redacted] weeks GA due to cervical changes consistent with early preterm labor. She has a reactive NST. She is currently not feeling contractions. She is getting BMZ and Magnesium sulfate. This is her second child and the baby is female.  I spoke with the patient with her husband. We discussed the worst case of delivery in the next 1-2 days, including usual DR management, possible respiratory complications and need for support, IV access, feedings (mother desires breast feeding, which was encouraged), LOS, Mortality and Morbidity, and long term outcomes. They had a few questions, which I answered. I offered a NICU tour to the father and would be glad to come back if they have more questions later.  Thank you for asking me to see this patient.  Doretha Souhristie C. Cristy Colmenares, MD Neonatologist  The total length of face-to-face or floor/unit time for this encounter was 20 minutes. Counseling and/or coordination of care was 15 minutes of the above.

## 2016-07-20 LAB — URINE CULTURE: CULTURE: NO GROWTH

## 2016-07-20 MED ORDER — SALINE SPRAY 0.65 % NA SOLN
1.0000 | NASAL | Status: DC | PRN
  Filled 2016-07-20: qty 44

## 2016-07-20 NOTE — Progress Notes (Signed)
HD #2 preterm cervical change  S: Pt notes poor sleep last night. Still with low pelvic pressure but no VB, LOF, ctx. Some heartburn. Good FM. No HA, no blurry vision, occasional SOB  O: Vitals:   07/20/16 0958 07/20/16 1055  BP:    Pulse:    Resp: 16 16  Temp:     Vitals:   07/20/16 0830 07/20/16 0900 07/20/16 0958 07/20/16 1055  BP: 98/74     Pulse: 100     Resp:  16 16 16   Temp:      TempSrc:      Weight:      Height:       Gen: well appearing, anxious Abd: soft, gravid, NT LE: SCDs in place, NT, no edema GU: cvx: 3/80%/ vtx/ soft/ mid/ -3  NST: 120s, + accels, no decels, 10 beat variability Toco: none  CBC    Component Value Date/Time   WBC 11.0 (H) 07/19/2016 1300   RBC 4.07 07/19/2016 1300   HGB 12.3 07/19/2016 1300   HCT 35.0 (L) 07/19/2016 1300   PLT 265 07/19/2016 1300   MCV 86.0 07/19/2016 1300   MCH 30.2 07/19/2016 1300   MCHC 35.1 07/19/2016 1300   RDW 13.7 07/19/2016 1300    UCx penindg  U/s: EFW 5# ( LGA)/ cvx: 2.8 cm dilated at int os, 0.5cm closed portion  A/P: 35 yo G2P1 at 31'6 with preterm cervical change and a history of LEEP but prior term delivery. Pt did have early cervical change with last pregancy.  No continued cervical change over past 24 hrs. No noted contractions. Completing 24 hrs Magnesium prophylaxis. BMZ #2 due at 2pm. Given no change and pt able to do bed rest at home, will allow d/c to home for bedrest. Plan weekly visits. Pt is instructed to call with any ctx, LOF, VB for reassessment and at that time can consider tocolytics. Pt aware risks of prematurity.  Lesslie Mossa A. 07/20/2016 11:30 AM

## 2016-07-20 NOTE — Discharge Instructions (Signed)

## 2016-07-26 NOTE — Discharge Summary (Signed)
Patient ID: Sheri Bray MRN: [id number] DOB/AGE: 34/10/1980 34 y.o.  Admit date: 07/19/2016 Discharge date: 07/20/16  Admission Diagnoses: 31 wks, preterm cervical change  Discharge Diagnoses:  31 wks, preterm cervical change       Discharged Condition: stable  Hospital Course: Admitted with PT cervical change but no contractions. Pt received Magnesium and BMZ. She did not get tocolytics. She was watched overnight with rare irritability and reactive fetal testing. On HD #2, no further cervical change noted, no contractions noted. Repeat BMZ given. Pt d/c home.   Consults: None and MFM  Treatments: IV hydration, steroids: BMZ and magnesium  Disposition: home     Medication List    TAKE these medications   fluticasone 50 MCG/ACT nasal spray Commonly known as:  FLONASE Place 2 sprays into both nostrils at bedtime.   prenatal multivitamin Tabs tablet Take 1 tablet by mouth at bedtime.   ROLAIDS PO Take 2 tablets by mouth as needed (takes for heartburn).      Follow-up Information    Lendon ColonelFOGLEMAN,Mackay Hanauer A., MD .   Specialty:  Obstetrics and Gynecology Why:  Will need to be seen weekly  Contact information: [address] [place] [address] [phone]           Signed: Lendon ColonelFOGLEMAN,Gavina Dildine A., MD MD 07/26/2016, 1:22 PM

## 2016-08-13 LAB — OB RESULTS CONSOLE GBS: STREP GROUP B AG: NEGATIVE

## 2016-09-17 ENCOUNTER — Other Ambulatory Visit: Payer: Self-pay | Admitting: Certified Nurse Midwife

## 2016-09-21 ENCOUNTER — Inpatient Hospital Stay (HOSPITAL_COMMUNITY)
Admission: RE | Admit: 2016-09-21 | Discharge: 2016-09-23 | DRG: 774 | Disposition: A | Discharge status: Home / Self Care | Payer: BLUE CROSS/BLUE SHIELD | Source: Ambulatory Visit | Attending: Obstetrics | Admitting: Obstetrics

## 2016-09-21 DIAGNOSIS — Z6791 Unspecified blood type, Rh negative: Secondary | ICD-10-CM | POA: Diagnosis not present

## 2016-09-21 DIAGNOSIS — O9081 Anemia of the puerperium: Secondary | ICD-10-CM | POA: Diagnosis not present

## 2016-09-21 DIAGNOSIS — O26893 Other specified pregnancy related conditions, third trimester: Principal | ICD-10-CM | POA: Diagnosis present

## 2016-09-21 DIAGNOSIS — Z3493 Encounter for supervision of normal pregnancy, unspecified, third trimester: Secondary | ICD-10-CM | POA: Diagnosis present

## 2016-09-21 DIAGNOSIS — Z3A41 41 weeks gestation of pregnancy: Secondary | ICD-10-CM | POA: Diagnosis not present

## 2016-09-21 DIAGNOSIS — D62 Acute posthemorrhagic anemia: Secondary | ICD-10-CM | POA: Diagnosis not present

## 2016-09-21 LAB — CBC
HCT: 34.7 % — ABNORMAL LOW (ref 36.0–46.0)
Hemoglobin: 12.5 g/dL (ref 12.0–15.0)
MCH: 30.4 pg (ref 26.0–34.0)
MCHC: 36 g/dL (ref 30.0–36.0)
MCV: 84.4 fL (ref 78.0–100.0)
Platelets: 273 10*3/uL (ref 150–400)
RBC: 4.11 MIL/uL (ref 3.87–5.11)
RDW: 14.1 % (ref 11.5–15.5)
WBC: 9.2 10*3/uL (ref 4.0–10.5)

## 2016-09-21 MED ORDER — OXYTOCIN 10 UNIT/ML IJ SOLN
10.0000 [IU] | Freq: Once | INTRAMUSCULAR | Status: AC
  Administered 2016-09-22: 10 [IU] via INTRAMUSCULAR
  Filled 2016-09-21: qty 1

## 2016-09-21 MED ORDER — MISOPROSTOL 50MCG HALF TABLET
50.0000 ug | ORAL_TABLET | ORAL | Status: DC
  Administered 2016-09-21 – 2016-09-22 (×2): 50 ug via ORAL
  Filled 2016-09-21 (×2): qty 0.5

## 2016-09-21 MED ORDER — LACTATED RINGERS IV SOLN: 500.0000 mL | INTRAVENOUS | Status: DC | PRN

## 2016-09-21 MED ORDER — OXYCODONE-ACETAMINOPHEN 5-325 MG PO TABS: 1.0000 | ORAL_TABLET | ORAL | Status: DC | PRN

## 2016-09-21 MED ORDER — TERBUTALINE SULFATE 1 MG/ML IJ SOLN
0.2500 mg | Freq: Once | INTRAMUSCULAR | Status: DC | PRN
  Filled 2016-09-21: qty 1

## 2016-09-21 MED ORDER — ACETAMINOPHEN 325 MG PO TABS: 650.0000 mg | ORAL_TABLET | ORAL | Status: DC | PRN

## 2016-09-21 MED ORDER — LIDOCAINE HCL (PF) 1 % IJ SOLN
30.0000 mL | INTRAMUSCULAR | Status: DC | PRN
  Filled 2016-09-21: qty 30

## 2016-09-21 MED ORDER — OXYCODONE-ACETAMINOPHEN 5-325 MG PO TABS
2.0000 | ORAL_TABLET | ORAL | Status: DC | PRN
Start: 2016-09-21 — End: 2016-09-22

## 2016-09-21 MED ORDER — SOD CITRATE-CITRIC ACID 500-334 MG/5ML PO SOLN: 30.0000 mL | ORAL | Status: DC | PRN

## 2016-09-21 NOTE — Progress Notes (Signed)
No IV, Reg diet per Fredric MareBailey cnm

## 2016-09-21 NOTE — H&P (Signed)
  OB ADMISSION/ HISTORY & PHYSICAL: see additional note  Admission Date: 09/21/2016  5:41 PM  Admit Diagnosis: 40.6 weeks  Sheri Bray is a 35 y.o. female presenting for induction of labor post-dates with advanced dilation. Prodromal ctx / latent labor versus braxton hicks.  Medical / Surgical History :  Past medical history:  Past Medical History:  Diagnosis Date  . Medical history non-contributory     Past surgical history:  Past Surgical History:  Procedure Laterality Date  . CERVICAL CONE BIOPSY  2009  . LAPAROSCOPY    . LEEP  2009  . MOUTH SURGERY    . TONSILLECTOMY     Family History:  Family History  Problem Relation Age of Onset  . Arthritis Mother   . Asthma Mother   . Depression Mother   . Diabetes Mother   . Hypertension Father   . Depression Maternal Grandmother   . Stroke Maternal Grandmother   . Cancer Maternal Grandfather   . Diabetes Paternal Grandmother   . Cancer Paternal Grandfather   . Diabetes Paternal Grandfather      Social History:  reports that she has never smoked. She has never used smokeless tobacco. She reports that she does not drink alcohol or use drugs.  Allergies: Milk-related compounds and Sulfa antibiotics   Current Medications at time of admission:  Prior to Admission medications   Medication Sig Start Date End Date Taking? Authorizing Provider  fluticasone (FLONASE) 50 MCG/ACT nasal spray Place 2 sprays into both nostrils at bedtime.    Historical Provider, MD  Prenatal Vit-Fe Fumarate-FA (PRENATAL MULTIVITAMIN) TABS tablet Take 1 tablet by mouth at bedtime.    Historical Provider, MD   Review of Systems: Active FM bloody show absent    Assessment: 40.[redacted] weeks gestation  Induction of labor - desires to avoid pitocin / wants natural labor and birth with analgesia or epidural if possible  (hx last IOL - AROM for induction at 5cm dilation without progression into active labor requiring pitocin)  FHR category     Plan:  Admit Discussed induction methods - plan for Cytotec to establish ctx pattern then AROM / pitocin if needed   Dr Ernestina PennaFogleman notified of admission / plan of care   Marlinda MikeBAILEY, Prosperity Darrough CNM, MSN, [facility] 09/21/2016, 6:15 PM

## 2016-09-21 NOTE — Anesthesia Pain Management Evaluation Note (Signed)
  CRNA Pain Management Visit Note  Patient: Sheri Bray, 35 y.o., female  "Hello I am a member of the anesthesia team at [facility]. We have an anesthesia team available at all times to provide care throughout the hospital, including epidural management and anesthesia for C-section. I don't know your plan for the delivery whether it a natural birth, water birth, IV sedation, nitrous supplementation, doula or epidural, but we want to meet your pain goals."   1.Was your pain managed to your expectations on prior hospitalizations?   Yes   2.What is your expectation for pain management during this hospitalization?     Nitrous Oxide  3.How can we help you reach that goal? Nitrous, water birth  Record the patient's initial score and the patient's pain goal.   Pain: 0  Pain Goal: 6 [facility] wants you to be able to say your pain was always managed very well.  Sheri Bray 09/21/2016

## 2016-09-22 ENCOUNTER — Encounter (HOSPITAL_COMMUNITY): Payer: Self-pay

## 2016-09-22 LAB — CBC
HCT: 30.7 % — ABNORMAL LOW (ref 36.0–46.0)
Hemoglobin: 11.2 g/dL — ABNORMAL LOW (ref 12.0–15.0)
MCH: 30.6 pg (ref 26.0–34.0)
MCHC: 36.5 g/dL — ABNORMAL HIGH (ref 30.0–36.0)
MCV: 83.9 fL (ref 78.0–100.0)
Platelets: 244 10*3/uL (ref 150–400)
RBC: 3.66 MIL/uL — ABNORMAL LOW (ref 3.87–5.11)
RDW: 13.9 % (ref 11.5–15.5)
WBC: 20.4 10*3/uL — ABNORMAL HIGH (ref 4.0–10.5)

## 2016-09-22 LAB — RPR: RPR Ser Ql: NONREACTIVE

## 2016-09-22 MED ORDER — OXYCODONE-ACETAMINOPHEN 5-325 MG PO TABS
2.0000 | ORAL_TABLET | Freq: Once | ORAL | Status: AC
  Administered 2016-09-22: 2 via ORAL
  Filled 2016-09-22: qty 2

## 2016-09-22 MED ORDER — COCONUT OIL OIL
1.0000 "application " | TOPICAL_OIL | Status: DC | PRN
  Administered 2016-09-23: 1 via TOPICAL
  Filled 2016-09-22: qty 120

## 2016-09-22 MED ORDER — MISOPROSTOL 200 MCG PO TABS
800.0000 ug | ORAL_TABLET | Freq: Once | ORAL | Status: AC
  Administered 2016-09-22: 800 ug via RECTAL

## 2016-09-22 MED ORDER — SALINE SPRAY 0.65 % NA SOLN
1.0000 | NASAL | Status: DC | PRN
  Administered 2016-09-22: 1 via NASAL
  Filled 2016-09-22: qty 44

## 2016-09-22 MED ORDER — DIBUCAINE 1 % RE OINT: 1.0000 "application " | TOPICAL_OINTMENT | RECTAL | Status: DC | PRN

## 2016-09-22 MED ORDER — IBUPROFEN 600 MG PO TABS
600.0000 mg | ORAL_TABLET | Freq: Four times a day (QID) | ORAL | Status: DC
  Administered 2016-09-22 – 2016-09-23 (×5): 600 mg via ORAL
  Filled 2016-09-22 (×5): qty 1

## 2016-09-22 MED ORDER — BENZOCAINE-MENTHOL 20-0.5 % EX AERO
1.0000 | INHALATION_SPRAY | CUTANEOUS | Status: DC | PRN
  Administered 2016-09-22: 1 via TOPICAL
  Filled 2016-09-22: qty 56

## 2016-09-22 MED ORDER — WITCH HAZEL-GLYCERIN EX PADS: 1.0000 "application " | MEDICATED_PAD | CUTANEOUS | Status: DC | PRN

## 2016-09-22 MED ORDER — METHYLERGONOVINE MALEATE 0.2 MG PO TABS
0.2000 mg | ORAL_TABLET | Freq: Three times a day (TID) | ORAL | Status: AC
  Administered 2016-09-22 (×3): 0.2 mg via ORAL
  Filled 2016-09-22 (×3): qty 1

## 2016-09-22 MED ORDER — MISOPROSTOL 200 MCG PO TABS
ORAL_TABLET | ORAL | Status: AC
  Filled 2016-09-22: qty 4

## 2016-09-22 MED ORDER — ACETAMINOPHEN 325 MG PO TABS
650.0000 mg | ORAL_TABLET | ORAL | Status: DC | PRN
  Administered 2016-09-23: 650 mg via ORAL
  Filled 2016-09-22: qty 2

## 2016-09-22 NOTE — Progress Notes (Signed)
Marlinda Mikeanya bailey notified of pt continued bleeding  - 1000cc blood loss at present - no new orders at present - will continue to monitor

## 2016-09-22 NOTE — Progress Notes (Signed)
S: feeling ctx - uncomfortable and painful  O:  VS: Blood pressure (!) 106/56, pulse 80, temperature 98.8 F (37.1 C), temperature source Oral, resp. rate 18, height 5\' 3"  (1.6 m), weight 79.4 kg (175 lb), unknown if currently breastfeeding.        FHR : baseline 130 / variability moderate / accelerations + / no decelerations        Toco: contractions every 4-6 minutes / mild - moderate         Cervix : 8 / 95%  / vtx 0 station        Membranes: BBOW        Hand palpable at side of head inside bag of water between ctx - pinched with successful Moro to resolve compound presentation (vtx without palpable fingers/hands with next 2 ctx)        AROM - light yellow color        doula at bedside / water temp 100  A: active labor     FHR category 1  P: water immersion - continuous labor support   Marlinda MikeBAILEY, Quinne Pires CNM, MSN, [facility] 09/22/2016, 5:16 AM

## 2016-09-22 NOTE — Lactation Note (Signed)
This note was copied from a baby's chart. Lactation Consultation Note  Patient Name: Boy Creta Levinmanda Murchison WUJWJ'XToday's Date: 09/22/2016 Reason for consult: Initial assessment    With this mom and term LGA baby, weighing just over 11 pounds. Mom was breast feeding the baby in football hold when I walked in the room. I showed mom how to waken baby to suck with ear lobe stimulation. He was sucking with strong, rhythmic suckles, and had a deep latch, with lots of breast movement, and mom was comfortable.I was not able to hand express any colostrum, but mom had milk in the tip of her nipple after baby was latched. Mom had an abundant supple with her last child.  She was happy about how well Mal AmabileBrock was feeding, due to her first shield having a tongue tie, and she needed to pump and bottle feed a lot. Basic breats feeding and lactation services teaching done. Dad present  and very involved. Mom knows to call for questions/conerns.   Maternal Data Formula Feeding for Exclusion: No Has patient been taught Hand Expression?: Yes Does the patient have breastfeeding experience prior to this delivery?: Yes  Feeding Feeding Type: Breast Fed Length of feed: 30 min  LATCH Score/Interventions Latch: Grasps breast easily, tongue down, lips flanged, rhythmical sucking. Intervention(s): Assist with latch;Adjust position  Audible Swallowing: A few with stimulation Intervention(s): Skin to skin  Type of Nipple: Everted at rest and after stimulation (very soft but evert)  Comfort (Breast/Nipple): Soft / non-tender     Hold (Positioning): No assistance needed to correctly position infant at breast.  LATCH Score: 9  Lactation Tools Discussed/Used     Consult Status Consult Status: Follow-up Date: 09/23/16 Follow-up type: In-patient    Alfred LevinsLee, Darria Corvera Anne 09/22/2016, 2:27 PM

## 2016-09-22 NOTE — Progress Notes (Signed)
S:  Painful ctx - pressure       Wants nitrous - out of tub  O:  VS: Blood pressure (!) 106/56, pulse 80, temperature 98.8 F (37.1 C), temperature source Oral, resp. rate 18, height 5\' 3"  (1.6 m), weight 79.4 kg (175 lb), unknown if currently breastfeeding.        FHR monitor reapplied out of tub : baseline 135 / variability moderate / accelerations absent / no decelerations        Toco: contractions every 2-5 minutes / moderate         Cervix : 10cm / 100% vtx +1        Membranes: light yellow with show  A: active labor     FHR category 2  P: trial of nitrous - continuous labor support   Marlinda MikeBAILEY, Sonika Levins CNM, MSN, [facility]

## 2016-09-23 DIAGNOSIS — D62 Acute posthemorrhagic anemia: Secondary | ICD-10-CM | POA: Diagnosis not present

## 2016-09-23 LAB — CBC
HCT: 27.1 % — ABNORMAL LOW (ref 36.0–46.0)
Hemoglobin: 9.8 g/dL — ABNORMAL LOW (ref 12.0–15.0)
MCH: 30.6 pg (ref 26.0–34.0)
MCHC: 36.2 g/dL — ABNORMAL HIGH (ref 30.0–36.0)
MCV: 84.7 fL (ref 78.0–100.0)
Platelets: 266 10*3/uL (ref 150–400)
RBC: 3.2 MIL/uL — ABNORMAL LOW (ref 3.87–5.11)
RDW: 14.2 % (ref 11.5–15.5)
WBC: 14.4 10*3/uL — ABNORMAL HIGH (ref 4.0–10.5)

## 2016-09-23 MED ORDER — POLYSACCHARIDE IRON COMPLEX 150 MG PO CAPS: 150.0000 mg | ORAL_CAPSULE | Freq: Two times a day (BID) | ORAL | 3 refills | Status: DC

## 2016-09-23 MED ORDER — RHO D IMMUNE GLOBULIN 1500 UNIT/2ML IJ SOSY
300.0000 ug | PREFILLED_SYRINGE | Freq: Once | INTRAMUSCULAR | Status: AC
  Administered 2016-09-23: 300 ug via INTRAMUSCULAR
  Filled 2016-09-23: qty 2

## 2016-09-23 MED ORDER — MAGNESIUM OXIDE 400 MG PO TABS: 400.0000 mg | ORAL_TABLET | Freq: Every day | ORAL | Status: DC

## 2016-09-23 MED ORDER — COCONUT OIL OIL: 1.0000 "application " | TOPICAL_OIL | 0 refills | Status: DC | PRN

## 2016-09-23 MED ORDER — BENZOCAINE-MENTHOL 20-0.5 % EX AERO: 1.0000 "application " | INHALATION_SPRAY | CUTANEOUS | Status: DC | PRN

## 2016-09-23 MED ORDER — IBUPROFEN 600 MG PO TABS: 600.0000 mg | ORAL_TABLET | Freq: Four times a day (QID) | ORAL | 0 refills | Status: DC

## 2016-09-23 MED ORDER — ALFALFA 250 MG PO TABS: 2.0000 | ORAL_TABLET | Freq: Four times a day (QID) | ORAL | 0 refills | Status: DC

## 2016-09-23 NOTE — Lactation Note (Signed)
This note was copied from a baby's chart. Lactation Consultation Note New mom needing assistance and reassurance w/latching. Had a lot of questions, teach done. Very receptive to education. Nipples have a slight red area in center of nipple. Has coconut oil. Encouraged to use.  Mom has pendulum breast. Encouraged to place dry cloth for support under breast during BF. Has compressible breast and nipples. Has semi flat nipples at rest, everts w/stimulation. Breast massage taught, as well as holding positions for latching. Football hold taught w/placement and props. Encouraged to cut nails to prevent nipple injury. Baby aggressive at this time. Discussed cluster feeding and newborn feeding habits and behavior. Repositioned baby for breathing better at breast.  Patient Name: Boy Creta Levinmanda Bigley IONGE'XToday's Date: 09/23/2016 Reason for consult: Follow-up assessment   Maternal Data    Feeding Feeding Type: Breast Fed Length of feed: 25 min  LATCH Score/Interventions Latch: Repeated attempts needed to sustain latch, nipple held in mouth throughout feeding, stimulation needed to elicit sucking reflex. Intervention(s): Adjust position;Assist with latch;Breast massage;Breast compression  Audible Swallowing: A few with stimulation Intervention(s): Skin to skin;Hand expression Intervention(s): Alternate breast massage  Type of Nipple: Everted at rest and after stimulation  Comfort (Breast/Nipple): Filling, red/small blisters or bruises, mild/mod discomfort  Problem noted: Mild/Moderate discomfort Interventions (Mild/moderate discomfort): Hand massage;Hand expression;Pre-pump if needed  Hold (Positioning): Assistance needed to correctly position infant at breast and maintain latch. Intervention(s): Breastfeeding basics reviewed;Support Pillows;Position options;Skin to skin  LATCH Score: 6  Lactation Tools Discussed/Used Tools: Shells;Pump Shell Type: Inverted Breast pump type: Manual Pump Review:  Setup, frequency, and cleaning;Milk Storage Initiated by:: Peri JeffersonL. Arvon Schreiner RN IBCLC Date initiated:: 09/23/16   Consult Status Consult Status: Follow-up Date: 09/24/16 Follow-up type: In-patient    Charyl DancerCARVER, Bijou Easler G 09/23/2016, 6:17 AM

## 2016-09-23 NOTE — Lactation Note (Signed)
This note was copied from a baby's chart. Lactation Consultation Note LGA Baby.  P2. Baby latched upon entering but came off while LC in room. Reviewed hand expression, glistening expressed. Baby rooting and just came off breast after long cluster feeding sessions. Baby tongue humping. Demonstrated how to achieve a deeper latch. Intermittent swallows observed.  Encouraged mother to compress breast to latch baby deep and sustain latch and compress during feeding. Suggest mother start post pumping a 2-3 times a day for 10-15 min to boost her milk supply and give baby back volume pumped. Discussed milk storage. Taught parents how to finger syringe feed. Mom encouraged to feed baby 8-12 times/24 hours and with feeding cues.  Reviewed engorgement care and monitoring voids/stools.   Patient Name: Boy Sheri Bray ZOXWR'UToday's Date: 09/23/2016 Reason for consult: Follow-up assessment   Maternal Data    Feeding Feeding Type: Breast Fed Length of feed: 25 min  LATCH Score/Interventions Latch: Grasps breast easily, tongue down, lips flanged, rhythmical sucking. Intervention(s): Adjust position;Assist with latch;Breast massage;Breast compression  Audible Swallowing: A few with stimulation Intervention(s): Skin to skin;Hand expression Intervention(s): Alternate breast massage;Skin to skin  Type of Nipple: Everted at rest and after stimulation  Comfort (Breast/Nipple): Soft / non-tender  Problem noted: Mild/Moderate discomfort Interventions (Mild/moderate discomfort):  (LC gave Mom shells, coconut oil, adv EBM)  Hold (Positioning): No assistance needed to correctly position infant at breast. Intervention(s): Skin to skin  LATCH Score: 9  Lactation Tools Discussed/Used Shell Type: Inverted (latched upon entering on L. shell R)   Consult Status Consult Status: Complete    Hardie PulleyBerkelhammer, Sheri Bray Boschen 09/23/2016, 11:36 AM

## 2016-09-23 NOTE — Progress Notes (Signed)
PPD # 1 SVD Information for the patient's newborn:  Sheri Bray, Boy Tyteanna [[id number]][[id number]]  female    breast feeding  / Circumcision planned Baby name: Sheri Bray  S:  Reports feeling tired but well, desires early DC.             Tolerating po/ No nausea or vomiting             Bleeding is decreased             Pain controlled with acetaminophen and ibuprofen (OTC)             Up ad lib / ambulatory / voiding without difficulties        O:  A & O x 3, in no apparent distress              VS:  Vitals:   09/22/16 1539 09/22/16 1845 09/22/16 2231 09/23/16 0853  BP: 99/70 (!) 108/44 93/67 97/65   Pulse: (!) 110 82 83 95  Resp: 18 18 18 18   Temp: 98.2 F (36.8 C) 98 F (36.7 C) 98.3 F (36.8 C) 98.7 F (37.1 C)  TempSrc: Oral  Oral Oral  SpO2: 99%  98%   Weight:      Height:        LABS:  Recent Labs  09/22/16 1014 09/23/16 0530  WBC 20.4* 14.4*  HGB 11.2* 9.8*  HCT 30.7* 27.1*  PLT 244 266    Blood type: --/--/A NEG (11/25 1943) / Baby Rh positive  Rubella: Immune (09/22 0000)   I&O: I/O last 3 completed shifts: In: -  Out: 1662 [Urine:250; Blood:1412]          No intake/output data recorded.  Lungs: Clear and unlabored  Heart: regular rate and rhythm / no murmurs  Abdomen: soft, non-tender, non-distended             Fundus: firm, non-tender, U-@  Perineum: no edema  Lochia: small   Extremities: no edema, no calf pain or tenderness    A/P: PPD # 1 35 y.o., [id number]   Principal Problem:   Postpartum care following vaginal delivery (11/26) Active Problems:   SVD (spontaneous vaginal delivery)   Acute blood loss anemia  Postpartum hemorrhage RH neg w/ Rh pos infant - Rhophylac prior to DC   Doing well - stable status  Start oral Fe and Mag-ox, continue 4-6 wks PP  Add Alfalfa supplement for milk support and iron boost  Routine post partum orders  DC home today    Neta Mendsaniela C Paul, MSN, CNM 09/23/2016, 9:33 AM

## 2016-09-23 NOTE — Lactation Note (Signed)
This note was copied from a baby's chart. Lactation Consultation Note  Baby 31 hours old.  Was circumcised today and has been sleepy.  Cluster fed between 0500 until approx 1100. Mother has been hand expressing and using hand pump to stimulate breast while he has been sleeping. Mother prepumped.  Reviewed waking techniques.  Unwrapped him. Assisted w/ latching in football hold.  Baby latched.  A few swallows observed. Discussed cluster feeding.  Encouraged mother to pump a few times a day and give baby back volume pumped at next feeding. Suggest mother call if she needs further assistance.    Patient Name: Sheri Bray NWGNF'AToday's Date: 09/23/2016 Reason for consult: Follow-up assessment   Maternal Data    Feeding Feeding Type: Breast Fed Length of feed: 0 min (sleepy, unable to latch)  LATCH Score/Interventions Latch: Repeated attempts needed to sustain latch, nipple held in mouth throughout feeding, stimulation needed to elicit sucking reflex.  Audible Swallowing: A few with stimulation Intervention(s): Skin to skin;Hand expression Intervention(s): Alternate breast massage  Type of Nipple: Everted at rest and after stimulation  Comfort (Breast/Nipple): Soft / non-tender  Problem noted: Mild/Moderate discomfort  Hold (Positioning): No assistance needed to correctly position infant at breast.  LATCH Score: 8  Lactation Tools Discussed/Used     Consult Status Consult Status: Complete    Hardie PulleyBerkelhammer, Lynna Zamorano Boschen 09/23/2016, 3:28 PM

## 2016-09-23 NOTE — Discharge Summary (Signed)
Obstetric Discharge Summary Reason for Admission: induction of labor and late term, advanced dilation Prenatal Procedures: ultrasound and betamethasone course and Magnesium Sulfate for threatened preterm labor Intrapartum Procedures: spontaneous vaginal delivery and hydrotherapy, Nitrous Oxide Postpartum Procedures: Rho(D) Ig Complications-Operative and Postpartum: hemorrhage Hemoglobin  Date Value Ref Range Status  09/23/2016 9.8 (L) 12.0 - 15.0 g/dL Final   HCT  Date Value Ref Range Status  09/23/2016 27.1 (L) 36.0 - 46.0 % Final    Physical Exam:  General: alert, cooperative and no distress Lochia: appropriate Uterine Fundus: firm Incision: NA DVT Evaluation: No cords or calf tenderness. No significant calf/ankle edema.  Discharge Diagnoses: Term Pregnancy-delivered and acute blood loss anemia  Discharge Information: Date: 09/23/2016 Activity: pelvic rest Diet: routine Medications: PNV, Ibuprofen, Iron and MagOx Condition: stable Instructions: refer to practice specific booklet Discharge to: home Follow-up Information    Sheri Bray, TANYA, CNM. Schedule an appointment as soon as possible for a visit in 6 week(s).   Specialty:  Obstetrics and Gynecology Contact information: [address] [place] [address] [phone]           Newborn Data: Live born female Mal AmabileBrock Birth Weight: 11 lb 0.5 oz (5005 g) APGAR: 8, 9  Home with mother.  Neta MendsDaniela C Margarie Mcguirt, CNM 09/23/2016, 10:09 AM

## 2016-09-24 LAB — RH IG WORKUP (INCLUDES ABO/RH)
ABO/RH(D): A NEG
Fetal Screen: NEGATIVE
GESTATIONAL AGE(WKS): 41
UNIT DIVISION: 0

## 2016-09-25 LAB — TYPE AND SCREEN
ABO/RH(D): A NEG
Antibody Screen: POSITIVE
DAT, IgG: NEGATIVE
Unit division: 0
Unit division: 0

## 2016-10-03 NOTE — H&P (Signed)
OB ADMISSION/ HISTORY & PHYSICAL:  Admission Date: 09/21/2016  5:41 PM  Admit Diagnosis: 41 weeks labor  Danise Edgemanda E Bicking is a 35 y.o. female presenting for onset of labor.  Prenatal History: [id number]   EDC : 09/15/2016, by Other Basis  [facility]  Primary Ob Provider: Fredric MareBailey CNM Prenatal course complicated by preterm labor / hx [facility] bx  Prenatal Labs: ABO, Rh: --/--/A NEG (11/27 0531) Antibody: POS (11/25 1943) Rubella: Immune (09/22 0000)  RPR: Non Reactive (11/25 1943)  HBsAg: Negative (09/22 0000)  HIV: Non-reactive (09/22 0000)  GTT: nl GBS: Negative (10/17 0000)   Medical / Surgical History :  Past medical history:  Past Medical History:  Diagnosis Date  . Medical history non-contributory      Past surgical history:  Past Surgical History:  Procedure Laterality Date  . CERVICAL CONE BIOPSY  2009  . LAPAROSCOPY    . LEEP  2009  . MOUTH SURGERY    . TONSILLECTOMY      Family History:  Family History  Problem Relation Age of Onset  . Arthritis Mother   . Asthma Mother   . Depression Mother   . Diabetes Mother   . Hypertension Father   . Depression Maternal Grandmother   . Stroke Maternal Grandmother   . Cancer Maternal Grandfather   . Diabetes Paternal Grandmother   . Cancer Paternal Grandfather   . Diabetes Paternal Grandfather      Social History:  reports that she has never smoked. She has never used smokeless tobacco. She reports that she does not drink alcohol or use drugs.   Allergies: Milk-related compounds and Sulfa antibiotics    Current Medications at time of admission:  Prior to Admission medications   Medication Sig Start Date End Date Taking? Authorizing Provider  fluticasone (FLONASE) 50 MCG/ACT nasal spray Place 2 sprays into both nostrils at bedtime.   Yes Historical Provider, MD  Prenatal Vit-Fe Fumarate-FA (PRENATAL MULTIVITAMIN) TABS tablet Take 1 tablet by mouth at bedtime.   Yes Historical  Provider, MD  sodium chloride (OCEAN) 0.65 % SOLN nasal spray Place 1 spray into both nostrils as needed for congestion.   Yes Historical Provider, MD   Review of Systems: Active FM onset of ctx currently every 2-4 minutes bloody show present  Physical Exam:  VS: Blood pressure 97/65, pulse 95, temperature 98.7 F (37.1 C), temperature source Oral, resp. rate 18, height 5\' 3"  (1.6 m), weight 79.4 kg (175 lb), SpO2 98 %, unknown if currently breastfeeding.  General: alert and oriented, appears uncomfortable Heart: RRR Lungs: Clear lung fields Abdomen: Gravid, soft and non-tender, non-distended / uterus: gravid Extremities: 1+ pedal  edema  FHR: baseline rate 130 / variability moderate / accelerations + / no decelerations TOCO: Q2-4  Assessment: [redacted] weeks gestation latent stage of labor FHR category 1   Plan:  Admit - expectant management Declines intervention at this time Re-evaluate in 4-6 hours when labor increases or sooner with painful ctx  Marlinda MikeBAILEY, Debria Broecker CNM, MSN, [facility] 10/03/2016, 10:23 AM

## 2017-11-24 IMAGING — US US MFM OB COMP +14 WKS
1 series · 14 of 28 positions shown · non-contrast
Comparison: none

[Series 1: us mfm ob comp +14 wks · 59 acquisitions, 14 frames shown]
[im 3/59]
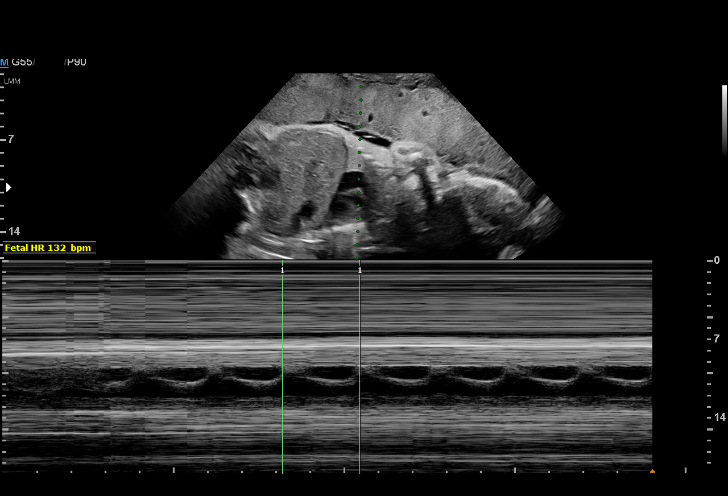
[im 7/59]
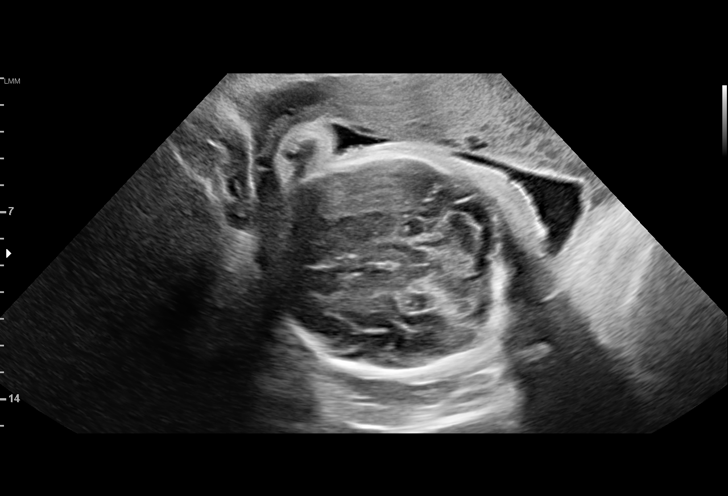
[im 11/59]
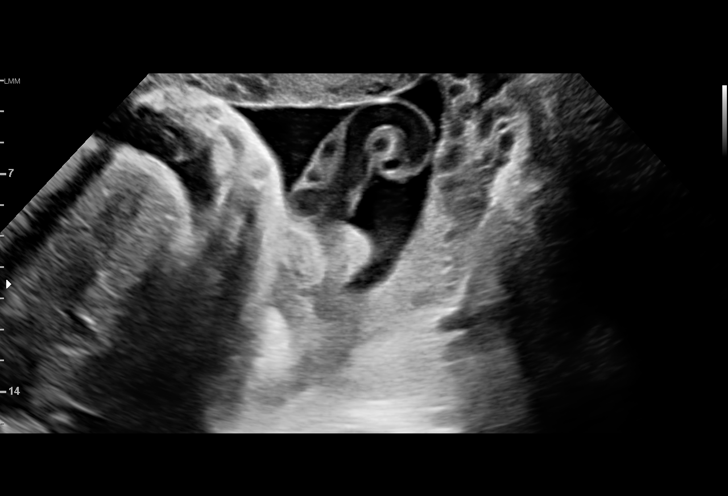
[im 16/59]
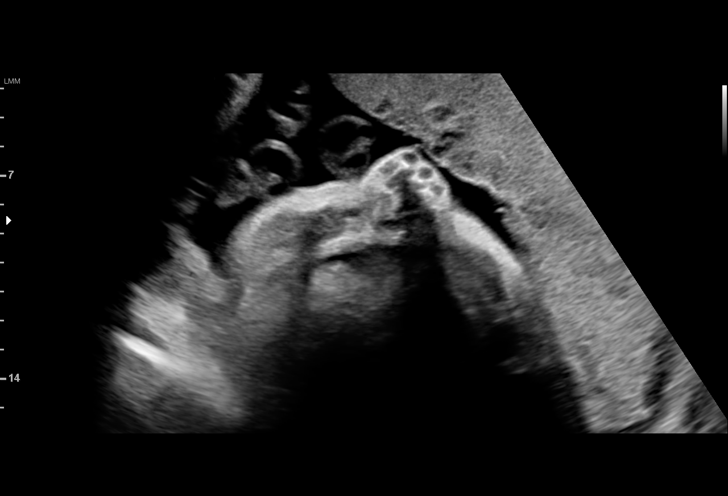
[im 20/59]
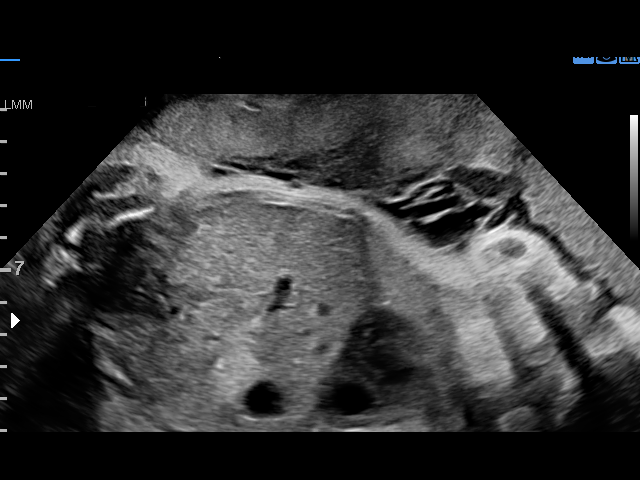
[im 24/59]
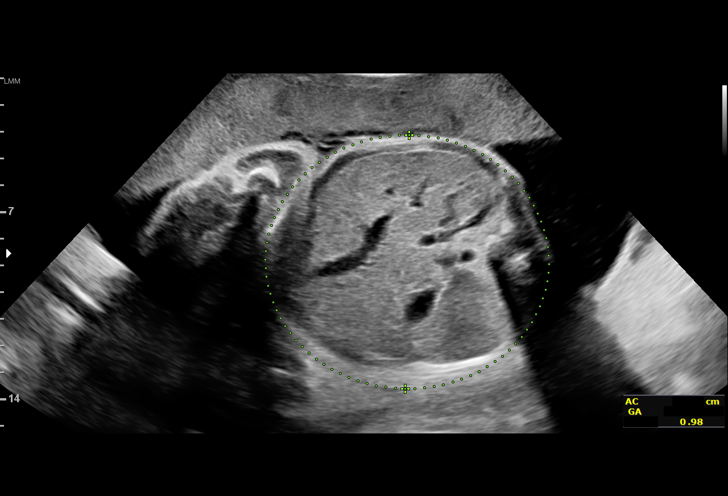
[im 28/59]
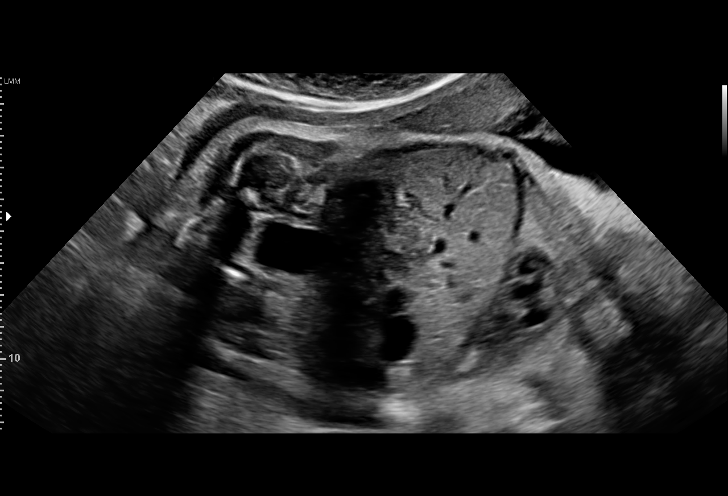
[im 33/59]
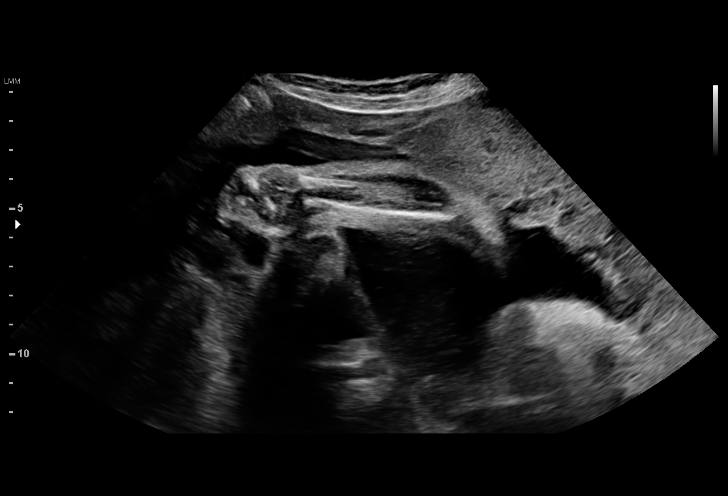
[im 37/59]
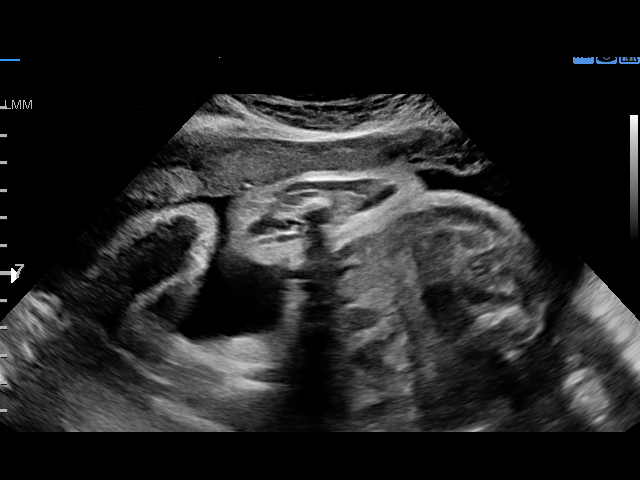
[im 41/59]
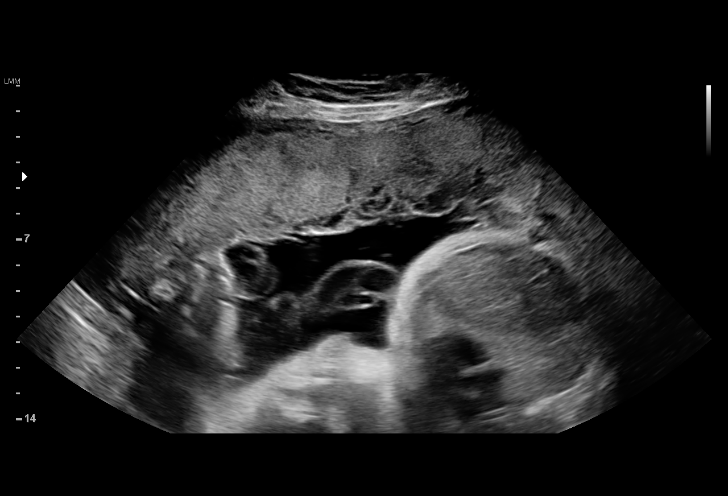
[im 46/59]
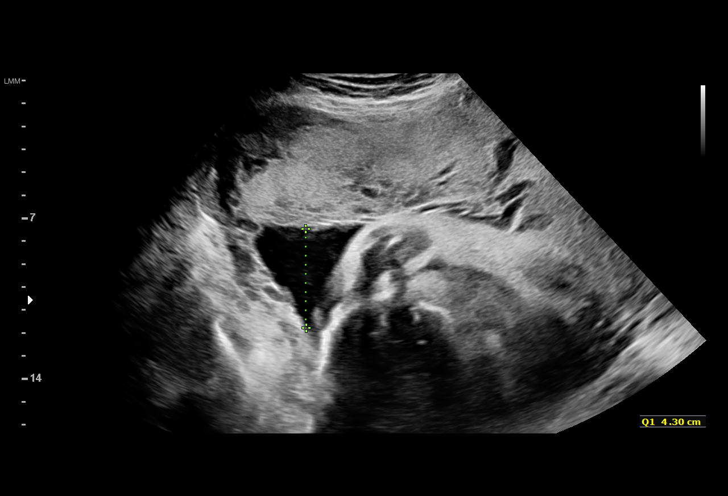
[im 50/59]
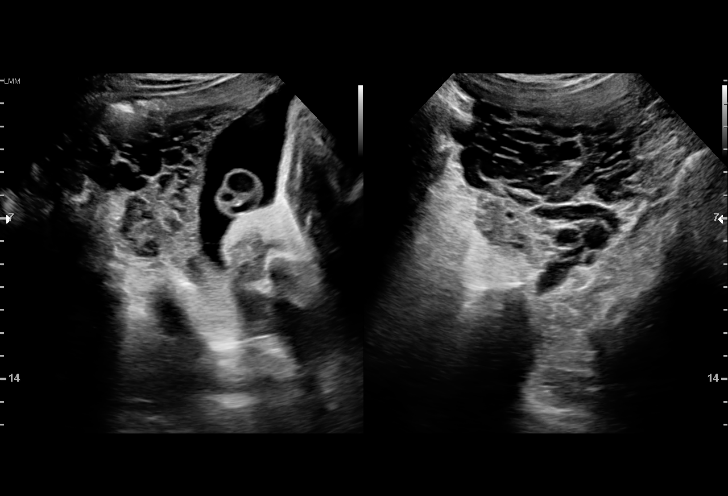
[im 54/59]
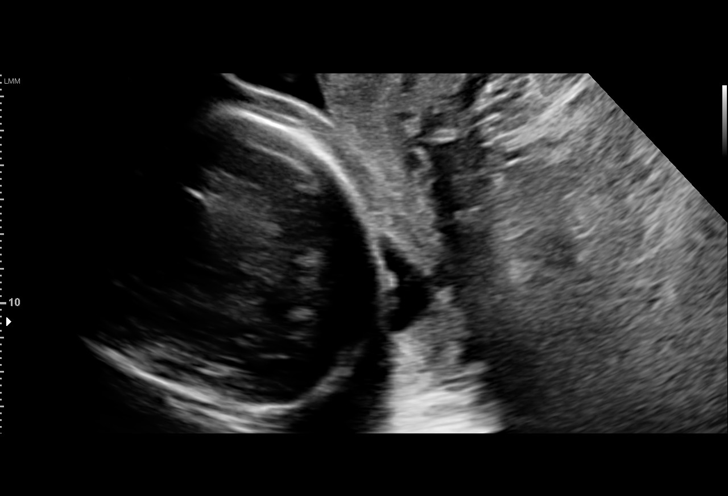
[im 59/59]
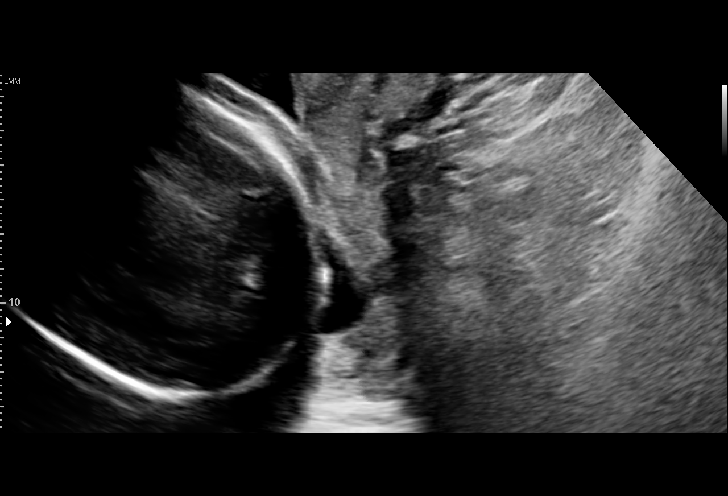

[14 of 28 positions shown; findings below may reference images not displayed]

Attending:        Moose Safodien        Secondary Phy.:   ANTE Nursing-
Antenatal [REDACTED]9

1  MAGGIORINO LAURIOLA           [id number]      [id number]     [id number]
Indications

31 weeks gestation of pregnancy
Threatened preterm labor, antepartum
OB History

Gravidity:    2         Term:   1
Fetal Evaluation

Num Of Fetuses:     1
Fetal Heart         132
Rate(bpm):
Cardiac Activity:   Observed
Presentation:       Cephalic
Placenta:           Anterior, above cervical os
P. Cord Insertion:  Not well visualized

Amniotic Fluid
AFI FV:      Subjectively within normal limits

AFI Sum(cm)     %Tile       Largest Pocket(cm)
22.43           87

RUQ(cm)       RLQ(cm)       LUQ(cm)        LLQ(cm)
4.3
Biometry

BPD:      81.7  mm     G. Age:  32w 6d         74  %    CI:        72.25   %    70 - 86
FL/HC:      19.8   %    19.1 -
HC:      305.8  mm     G. Age:  34w 0d         76  %    HC/AC:      0.96        0.96 -
AC:      317.1  mm     G. Age:  35w 4d       > 97  %    FL/BPD:     74.2   %    71 - 87
FL:       60.6  mm     G. Age:  31w 4d         31  %    FL/AC:      19.1   %    20 - 24
HUM:      58.7  mm     G. Age:  34w 0d       > 95  %
CER:      40.5  mm     G. Age:  34w 4d         86  %

CM:        7.2  mm
Est. FW:    5429  gm      5 lb 3 oz     87  %
Gestational Age

LMP:           31w 5d        Date:  12/10/15                 EDD:   09/15/16
U/S Today:     33w 4d                                        EDD:   09/02/16
Best:          31w 5d     Det. By:  LMP  (12/10/15)          EDD:   09/15/16
Anatomy

Cranium:               Appears normal         Aortic Arch:            Not well visualized
Cavum:                 Appears normal         Ductal Arch:            Not well visualized
Ventricles:            Appears normal         Diaphragm:              Appears normal
Choroid Plexus:        Appears normal         Stomach:                Appears normal, left
sided
Cerebellum:            Appears normal         Abdomen:                Appears normal
Posterior Fossa:       Appears normal         Abdominal Wall:         Appears nml (cord
insert, abd wall)
Nuchal Fold:           Not applicable (>20    Cord Vessels:           Appears normal (3
wks GA)                                        vessel cord)
Face:                  Not well visualized    Kidneys:                Appear normal
Lips:                  Appears normal         Bladder:                Appears normal
Thoracic:              Appears normal         Spine:                  Not well visualized
Heart:                 Appears normal         Upper Extremities:      Visualized
(4CH, axis, and situs
RVOT:                  Not well visualized    Lower Extremities:      Visualized
LVOT:                  Not well visualized

Other:  Fetus appears to be a male. Technically difficult due to advanced GA
and fetal position.
Cervix Uterus Adnexa

Cervix
Length:            0.6  cm.
Funnel Width:      2.8  cm.
Funneling of internal os noted.

Uterus
No abnormality visualized.

Left Ovary
Not visualized.

Right Ovary
Not visualized.

Cul De Sac:   No free fluid seen.
Impression

Single IUP at 31w 5d
Cephalic presentation
Limited views of the fetal heart, face and spine obtained
The estimated fetal weight is at the 87th %tile
Anterior placenta without previa
Normal amniotic fluid volume

Cervicallength 6 mm with some funneling at the internal os
(translabial; unable to perform transvaginal ultrasound due to
bed)
Recommendations

Follow up ultasounds as clinically indicated

## 2018-12-25 DIAGNOSIS — R252 Cramp and spasm: Secondary | ICD-10-CM | POA: Insufficient documentation

## 2018-12-25 DIAGNOSIS — R112 Nausea with vomiting, unspecified: Secondary | ICD-10-CM | POA: Insufficient documentation

## 2018-12-25 DIAGNOSIS — O360990 Maternal care for other rhesus isoimmunization, unspecified trimester, not applicable or unspecified: Secondary | ICD-10-CM

## 2018-12-25 DIAGNOSIS — R21 Rash and other nonspecific skin eruption: Secondary | ICD-10-CM | POA: Insufficient documentation

## 2018-12-25 HISTORY — DX: Maternal care for other rhesus isoimmunization, unspecified trimester, not applicable or unspecified: O36.0990

## 2020-08-01 ENCOUNTER — Ambulatory Visit (INDEPENDENT_AMBULATORY_CARE_PROVIDER_SITE_OTHER): Payer: PRIVATE HEALTH INSURANCE | Admitting: Family Medicine

## 2020-08-01 ENCOUNTER — Other Ambulatory Visit: Payer: Self-pay

## 2020-08-01 ENCOUNTER — Encounter: Payer: Self-pay | Admitting: Family Medicine

## 2020-08-01 VITALS — BP 100/67 | HR 71 | Temp 98.5°F | Ht 63.25 in | Wt 118.0 lb

## 2020-08-01 DIAGNOSIS — L659 Nonscarring hair loss, unspecified: Secondary | ICD-10-CM | POA: Diagnosis not present

## 2020-08-01 DIAGNOSIS — Z23 Encounter for immunization: Secondary | ICD-10-CM | POA: Diagnosis not present

## 2020-08-01 DIAGNOSIS — Z7689 Persons encountering health services in other specified circumstances: Secondary | ICD-10-CM

## 2020-08-01 DIAGNOSIS — G479 Sleep disorder, unspecified: Secondary | ICD-10-CM | POA: Diagnosis not present

## 2020-08-01 DIAGNOSIS — K090 Developmental odontogenic cysts: Secondary | ICD-10-CM

## 2020-08-01 DIAGNOSIS — N926 Irregular menstruation, unspecified: Secondary | ICD-10-CM

## 2020-08-01 HISTORY — DX: Developmental odontogenic cysts: K09.0

## 2020-08-01 LAB — CBC WITH DIFFERENTIAL/PLATELET
Basophils Absolute: 0 10*3/uL (ref 0.0–0.1)
Basophils Relative: 0.7 % (ref 0.0–3.0)
Eosinophils Absolute: 0.1 10*3/uL (ref 0.0–0.7)
Eosinophils Relative: 1.2 % (ref 0.0–5.0)
HCT: 41.9 % (ref 36.0–46.0)
Hemoglobin: 14.2 g/dL (ref 12.0–15.0)
Lymphocytes Relative: 31.3 % (ref 12.0–46.0)
Lymphs Abs: 2 10*3/uL (ref 0.7–4.0)
MCHC: 33.8 g/dL (ref 30.0–36.0)
MCV: 88.9 fl (ref 78.0–100.0)
Monocytes Absolute: 0.3 10*3/uL (ref 0.1–1.0)
Monocytes Relative: 5.3 % (ref 3.0–12.0)
Neutro Abs: 3.9 10*3/uL (ref 1.4–7.7)
Neutrophils Relative %: 61.5 % (ref 43.0–77.0)
Platelets: 405 10*3/uL — ABNORMAL HIGH (ref 150.0–400.0)
RBC: 4.71 Mil/uL (ref 3.87–5.11)
RDW: 13.4 % (ref 11.5–15.5)
WBC: 6.3 10*3/uL (ref 4.0–10.5)

## 2020-08-01 LAB — VITAMIN D 25 HYDROXY (VIT D DEFICIENCY, FRACTURES): VITD: 30.66 ng/mL (ref 30.00–100.00)

## 2020-08-01 LAB — T3, FREE: T3, Free: 3.3 pg/mL (ref 2.3–4.2)

## 2020-08-01 LAB — TSH: TSH: 1.21 u[IU]/mL (ref 0.35–4.50)

## 2020-08-01 LAB — T4, FREE: Free T4: 0.69 ng/dL (ref 0.60–1.60)

## 2020-08-01 NOTE — Patient Instructions (Signed)
It was a pleasure to meet you today.  We will call you with lab results.   Please help [place] help you:  We are honored you have chosen [facility] for your Primary Care home. Below you will find basic instructions that you may need to access in the future. Please help [place] help you by reading the instructions, which cover many of the frequent questions we experience.   Prescription refills and request:  -In order to allow more efficient response time, please call your pharmacy for all refills. They will forward the request electronically to [place]. This allows for the quickest possible response. Request left on a nurse line can take longer to refill, since these are checked as time allows between office patients and other phone calls.  - refill request can take up to 3-5 working days to complete.  - If request is sent electronically and request is appropiate, it is usually completed in 1-2 business days.  - all patients will need to be seen routinely for all chronic medical conditions requiring prescription medications (see follow-up below). If you are overdue for follow up on your condition, you will be asked to make an appointment and we will call in enough medication to cover you until your appointment (up to 30 days).  - all controlled substances will require a face to face visit to request/refill.  - if you desire your prescriptions to go through a new pharmacy, and have an active script at original pharmacy, you will need to call your pharmacy and have scripts transferred to new pharmacy. This is completed between the pharmacy locations and not by your provider.    Results: Our office handles many outgoing and incoming calls daily. If we have not contacted you within 1 week about your results, please check your mychart to see if there is a message first and if not, then contact our office.  In helping with this matter, you help decrease call volume, and therefore allow [place] to be able to respond to  patients needs more efficiently.  We will always attempt to call you with results,  normal or abnormal. However, if we are unable to reach you we will send a message in your my chart with results.   Acute office visits (sick visit):  An acute visit is intended for a new problem and are scheduled in shorter time slots to allow schedule openings for patients with new problems. This is the appropriate visit to discuss a new problem. Problems will not be addressed by phone call or Echart message. Appointment is needed if requesting treatment. In order to provide you with excellent quality medical care with proper time for you to explain your problem, have an exam and receive treatment with instructions, these appointments should be limited to one new problem per visit. If you experience a new problem, in which you desire to be addressed, please make an acute office visit, we save openings on the schedule to accommodate you. Please do not save your new problem for any other type of visit, let us take care of it properly and quickly for you.   Follow up visits:  Depending on your condition(s) your provider will need to see you routinely in order to provide you with quality care and prescribe medication(s). Most chronic conditions (Example: hypertension, Diabetes, depression/anxiety... etc), require visits a couple times a year. Your provider will instruct you on proper follow up for your personal medical conditions and history. Please make certain to make  follow up appointments for your condition as instructed. Failing to do so could result in lapse in your medication treatment/refills. If you request a refill, and are overdue to be seen on a condition, we will always provide you with a 30 day script (once) to allow you time to schedule.    Medicare wellness (well visit): - we have a wonderful Nurse Selena Batten), that will meet with you and provide you will yearly medicare wellness visits. These visits should occur  yearly (can not be scheduled less than 1 calendar year apart) and cover preventive health, immunizations, advance directives and screenings you are entitled to yearly through your medicare benefits. Do not miss out on your entitled benefits, this is when medicare will pay for these benefits to be ordered for you.  These are strongly encouraged by your provider and is the appropriate type of visit to make certain you are up to date with all preventive health benefits. If you have not had your medicare wellness exam in the last 12 months, please make certain to schedule one by calling the office and schedule your medicare wellness with Selena Batten as soon as possible.   Yearly physical (well visit):  - Adults are recommended to be seen yearly for physicals. Check with your insurance and date of your last physical, most insurances require one calendar year between physicals. Physicals include all preventive health topics, screenings, medical exam and labs that are appropriate for gender/age and history. You may have fasting labs needed at this visit. This is a well visit (not a sick visit), new problems should not be covered during this visit (see acute visit).  - Pediatric patients are seen more frequently when they are younger. Your provider will advise you on well child visit timing that is appropriate for your their age. - This is not a medicare wellness visit. Medicare wellness exams do not have an exam portion to the visit. Some medicare companies allow for a physical, some do not allow a yearly physical. If your medicare allows a yearly physical you can schedule the medicare wellness with our nurse Selena Batten and have your physical with your provider after, on the same day. Please check with insurance for your full benefits.   Late Policy/No Shows:  - all new patients should arrive 15-30 minutes earlier than appointment to allow [place] time  to  obtain all personal demographics,  insurance information and for you to  complete office paperwork. - All established patients should arrive 10-15 minutes earlier than appointment time to update all information and be checked in .  - In our best efforts to run on time, if you are late for your appointment you will be asked to either reschedule or if able, we will work you back into the schedule. There will be a wait time to work you back in the schedule,  depending on availability.  - If you are unable to make it to your appointment as scheduled, please call 24 hours ahead of time to allow [place] to fill the time slot with someone else who needs to be seen. If you do not cancel your appointment ahead of time, you may be charged a no show fee.

## 2020-08-01 NOTE — Progress Notes (Signed)
Patient ID: Sheri Bray, female  DOB: [birth year], 39 y.o.   MRN: [id number] Patient Care Team    Relationship Specialty Notifications Start End  Natalia Leatherwood, DO PCP - General Family Medicine  08/01/20     Chief Complaint  Patient presents with  . Establish Care    FHx of thyroid  . Anemia    Hx after pregnacny;   Marland Kitchen Menstrual Problem    irreg cycle  . Insomnia    takes OTC meds    Subjective:  Sheri Bray is a 39 y.o.  female present for new patient establishment. All past medical history, surgical history, allergies, family history, immunizations, medications and social history were updated in the electronic medical record today. All recent labs, ED visits and hospitalizations within the last year were reviewed.  Patient presents today for new patient establishment with complaints of hair thinning over the last few months. She states she feels like more hair is coming out with brushing and washing her hair. She is noticing menstrual changes. She usually has a period every 28 to 30 days, however her menstrual cycle came early in July at around 25 days and then her next menstrual cycle was September 1, then she had another one  September 30. She reports she has about 5 days of menstrual cycle bleeding that is not heavy in nature.  She also endorses having difficulty sleeping. She states she falls asleep around 930 to 10:00 nightly. If she cannot fall asleep she take melatonin. She reports she does not nap. She wakes around 1 AM and has difficulty falling back to sleep frequently. 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 She reports she exercises 4-5 times a week. She really is not interested in prescribed medications at this time for her insomnia.  Depression screen PHQ 2/9 08/01/2020  Decreased Interest 0  Down, Depressed, Hopeless 1  PHQ - 2 Score 1  Altered sleeping 3  Tired, decreased energy 1  Change in  appetite 1  Feeling bad or failure about yourself  2  Trouble concentrating 0  Moving slowly or fidgety/restless 2  Suicidal thoughts 0  PHQ-9 Score 10   GAD 7 : Generalized Anxiety Score 08/01/2020  Nervous, Anxious, on Edge 2  Control/stop worrying 1  Worry too much - different things 2  Trouble relaxing 1  Restless 1  Easily annoyed or irritable 3  Afraid - awful might happen 3  Total GAD 7 Score 13       No flowsheet data found.   Immunization History  Administered Date(s) Administered  . Influenza Inj Mdck Quad Pf 08/15/2018  . Influenza,inj,Quad PF,6+ Mos 08/01/2020  . Influenza-Unspecified 06/28/2013, 07/19/2016  . Moderna SARS-COVID-2 Vaccination 01/13/2020, 02/08/2020  . Tdap 04/26/2014, 06/21/2016    No exam data present  Past Medical History:  Diagnosis Date  . Chicken pox   . Eruption cyst 08/01/2020  . GERD (gastroesophageal reflux disease)   . Hay fever   . HPV in female 2009  . 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 Milk-Related Compounds Nausea Only  . Sulfamethoxazole Rash   Past Surgical History:  Procedure Laterality Date  . CERVICAL CONE BIOPSY  2009  . LAPAROSCOPY    . LEEP  2009  . MOUTH SURGERY    . TONSILLECTOMY  2001   Family History  Problem Relation Age of  Onset  . Arthritis Mother   . Asthma Mother   . Depression Mother   . Diabetes Mother   . Thyroid disease Mother   . Hearing loss Mother   . Hypertension Father   . Hyperlipidemia Father   . Depression Maternal Grandmother   . Stroke Maternal Grandmother   . Asthma Maternal Grandmother   . COPD Maternal Grandmother   . Cancer Maternal Grandfather   . Diabetes Paternal Grandmother   . Cancer Paternal Grandfather   . Diabetes Paternal Grandfather   . Hypertension Brother   . Hyperlipidemia Brother    Social History   Social History Narrative   Marital status/children/pets: Married.   Education/employment:  Bachelor's degree. Employed as a travel Art therapistagent   Safety:      -Wears a bicycle helmet riding a bike: Yes     -smoke alarm in the home:Yes     - wears seatbelt: Yes     - Feels safe in their relationships: Yes    Allergies as of 08/01/2020      Reactions   Milk-related Compounds Nausea Only   Sulfamethoxazole Rash      Medication List       Accurate as of August 01, 2020 11:59 PM. If you have any questions, ask your nurse or doctor.        STOP taking these medications   Alfalfa 250 MG Tabs Stopped by: Felix Pacinienee Sigourney Portillo, DO   benzocaine-Menthol 20-0.5 % Aero Commonly known as: DERMOPLAST Stopped by: Felix Pacinienee Idabelle Mcpeters, DO   coconut oil Oil Stopped by: Felix Pacinienee Shadiyah Wernli, DO   ibuprofen 600 MG tablet Commonly known as: ADVIL Stopped by: Felix Pacinienee Nadezhda Pollitt, DO   iron polysaccharides 150 MG capsule Commonly known as: Nu-Iron Stopped by: Felix Pacinienee Donell Sliwinski, DO   magnesium oxide 400 MG tablet Commonly known as: MAG-OX Stopped by: Felix Pacinienee Esiquio Boesen, DO   prenatal multivitamin Tabs tablet Stopped by: Felix Pacinienee Mahagony Grieb, DO   sodium chloride 0.65 % Soln nasal spray Commonly known as: OCEAN Stopped by: Felix Pacinienee Keiondre Colee, DO     TAKE these medications   ELDERBERRY PO Take by mouth.   fluticasone 50 MCG/ACT nasal spray Commonly known as: FLONASE Place 2 sprays into both nostrils at bedtime.   MAG GLYCINATE PO Take 400 mg by mouth.   Melatonin Gummies 2.5 MG Chew Chew by mouth.   multivitamin capsule Take 1 capsule by mouth daily.   NON FORMULARY Sleep aid   vitamin B-12 1000 MCG tablet Commonly known as: CYANOCOBALAMIN Take 1,000 mcg by mouth daily.       All past medical history, surgical history, allergies, family history, immunizations andmedications were updated in the EMR today and reviewed under the history and medication portions of their EMR.    Recent Results (from the past 2160 hour(s))  CBC w/Diff     Status: Abnormal   Collection Time: 08/01/20 11:06 AM  Result Value Ref Range    WBC 6.3 4.0 - 10.5 K/uL   RBC 4.71 3.87 - 5.11 Mil/uL   Hemoglobin 14.2 12.0 - 15.0 g/dL   HCT 16.141.9 36 - 46 %   MCV 88.9 78.0 - 100.0 fl   MCHC 33.8 30.0 - 36.0 g/dL   RDW 09.613.4 04.511.5 - 40.915.5 %   Platelets 405.0 (H) 150 - 400 K/uL   Neutrophils Relative % 61.5 43 - 77 %   Lymphocytes Relative 31.3 12 - 46 %   Monocytes Relative 5.3 3 - 12 %   Eosinophils Relative 1.2 0 - 5 %  Basophils Relative 0.7 0 - 3 %   Neutro Abs 3.9 1.4 - 7.7 K/uL   Lymphs Abs 2.0 0.7 - 4.0 K/uL   Monocytes Absolute 0.3 0 - 1 K/uL   Eosinophils Absolute 0.1 0 - 0 K/uL   Basophils Absolute 0.0 0 - 0 K/uL  Iron, TIBC and Ferritin Panel     Status: None   Collection Time: 08/01/20 11:06 AM  Result Value Ref Range   Iron 107 40 - 190 mcg/dL   TIBC 329 518 - 841 mcg/dL (calc)   %SAT 29 16 - 45 % (calc)   Ferritin 29 16 - 154 ng/mL  TSH     Status: None   Collection Time: 08/01/20 11:06 AM  Result Value Ref Range   TSH 1.21 0.35 - 4.50 uIU/mL  T4, free     Status: None   Collection Time: 08/01/20 11:06 AM  Result Value Ref Range   Free T4 0.69 0.60 - 1.60 ng/dL    Comment: Specimens from patients who are undergoing biotin therapy and /or ingesting biotin supplements may contain high levels of biotin.  The higher biotin concentration in these specimens interferes with this Free T4 assay.  Specimens that contain high levels  of biotin may cause false high results for this Free T4 assay.  Please interpret results in light of the total clinical presentation of the patient.    T3, free     Status: None   Collection Time: 08/01/20 11:06 AM  Result Value Ref Range   T3, Free 3.3 2.3 - 4.2 pg/mL  Vitamin D (25 hydroxy)     Status: None   Collection Time: 08/01/20 11:06 AM  Result Value Ref Range   VITD 30.66 30.00 - 100.00 ng/mL    No results found.   ROS: 14 pt review of systems performed and negative (unless mentioned in an HPI)  Objective: BP 100/67   Pulse 71   Temp 98.5 F (36.9 C) (Oral)   Ht 5'  3.25" (1.607 m)   Wt 118 lb (53.5 kg)   LMP 07/27/2020   SpO2 100%   BMI 20.74 kg/m  Gen: Afebrile. No acute distress. Nontoxic in appearance, well-developed, well-nourished, very pleasant female HENT: AT. [place]. No cough. No hoarseness. Eyes:Pupils Equal Round Reactive to light, Extraocular movements intact,  Conjunctiva without redness, discharge or icterus. Neck/lymp/endocrine: Supple, no lymphadenopathy, no thyromegaly CV: RRR no murmur, no edema, +2/4 P posterior tibialis pulses.  Chest: CTAB, no wheeze, rhonchi or crackles. Normal respiratory effort. Good air movement. Skin:  Warm and well-perfused. Skin intact. Neuro/Msk:  Normal gait. PERLA. EOMi. Alert. Oriented x3. Psych: Normal affect, dress and demeanor. Normal speech. Normal thought content and judgment.   Assessment/plan: Sheri Bray is a 39 y.o. female present for est care Need for influenza vaccination Influenza vaccine administered today.  Hair loss/menstrual irregularity/sleep disturbance Lengthy discussion today surrounding possible causes of her complaints. Thyroid disorder runs in her family and could potentially explain all of her complaints. However, we will check vitamin D and iron as well given her history of iron deficiency anemia which also can cause hair loss as well as vitamin D levels. She is not interested in medications for her sleep disturbance or for her high PHQ/gad screening today. Per her report, she is a type of patient that does not like to take medications. We did discuss as human beings we can never achieve perfection and as we get older our bodies change and sometimes need more  medical intervention. If she feels she is doing okay with melatonin alone, she can certainly continue to use without harm. However, if her symptoms worsen I would encourage her to consider a trial of medication. She reports understanding and will think about it. She is already exercising and has a set bedtime, takes in  adequate nutrition, avoids caffeine and has a good sleep hygiene. - CBC w/Diff - Iron, TIBC and Ferritin Panel - TSH - T4, free - T3, free - Vitamin D (25 hydroxy) Follow-up dependent upon lab results and patient also desires to schedule a physical.  Orders Placed This Encounter  Procedures  . Flu Vaccine QUAD 6+ mos PF IM (Fluarix Quad PF)  . CBC w/Diff  . Iron, TIBC and Ferritin Panel  . TSH  . T4, free  . T3, free  . Vitamin D (25 hydroxy)   No orders of the defined types were placed in this encounter.  Referral Orders  No referral(s) requested today    > 30 Minutes was dedicated to this patient's encounter to include pre-visit review of chart, face-to-face time with patient and post-visit work- which include documentation and prescribing medications and/or ordering test when necessary.    Note is dictated utilizing voice recognition software. Although note has been proof read prior to signing, occasional typographical errors still can be missed. If any questions arise, please do not hesitate to call for verification.  Electronically signed by: Felix Pacini, DO [facility] [place]

## 2020-08-02 ENCOUNTER — Telehealth: Payer: Self-pay | Admitting: Family Medicine

## 2020-08-02 ENCOUNTER — Encounter: Payer: Self-pay | Admitting: Family Medicine

## 2020-08-02 LAB — IRON,TIBC AND FERRITIN PANEL
%SAT: 29 % (calc) (ref 16–45)
Ferritin: 29 ng/mL (ref 16–154)
Iron: 107 ug/dL (ref 40–190)
TIBC: 371 mcg/dL (calc) (ref 250–450)

## 2020-08-02 NOTE — Telephone Encounter (Signed)
Please inform patient - Iron levels in the blood are normal, but her ferritin (iron stores) are in the lower end of normal.  She would benefit from taking a ferrous sulfate 325 mg once daily, 2-3 times a week and/or incorporate more iron rich foods into her diet. Good plant sources of iron are lentils, chickpeas, beans, tofu, cashew nuts, pumpkin seeds, kale, dried apricots, figs, raisins, quinoa and fortified breakfast cereal.  -Vitamin D levels are low normal at 30.  Less than 30 is abnormal.  For bone health/hair health ideal levels are between 40-50 for vitamin D.  I would recommend she start 800 units of vitamin D daily, taken with food for best absorption.  - Thyroid levels are normal. - Blood cell counts are normal, with the exception of very mildly elevated platelets of 405, less than 400 is normal.  This can occur when ferritin/iron needs replaced.   The above levels could  explain some of her hair loss complaints, however they would not explain the changes she is experiencing in her menstrual cycle or insomnia since her thyroid panel is normal.   Changes in female hormones can also cause hair changes and menstrual cycle changes.  If irregularities continue we may need to pursue work-up on her hormones and/or consider gynecology referral.

## 2020-08-03 NOTE — Telephone Encounter (Signed)
Verified pt understanding  

## 2020-10-28 DIAGNOSIS — U071 COVID-19: Secondary | ICD-10-CM

## 2020-10-28 HISTORY — DX: COVID-19: U07.1

## 2020-11-07 ENCOUNTER — Encounter: Payer: PRIVATE HEALTH INSURANCE | Admitting: Family Medicine

## 2020-12-12 ENCOUNTER — Other Ambulatory Visit: Payer: Self-pay

## 2020-12-12 DIAGNOSIS — J309 Allergic rhinitis, unspecified: Secondary | ICD-10-CM | POA: Insufficient documentation

## 2020-12-13 ENCOUNTER — Encounter: Payer: Self-pay | Admitting: Family Medicine

## 2020-12-13 ENCOUNTER — Ambulatory Visit (INDEPENDENT_AMBULATORY_CARE_PROVIDER_SITE_OTHER): Payer: PRIVATE HEALTH INSURANCE | Admitting: Family Medicine

## 2020-12-13 VITALS — BP 109/72 | HR 92 | Temp 98.6°F | Ht 63.0 in | Wt 130.0 lb

## 2020-12-13 DIAGNOSIS — Z130 Encounter for screening for diseases of the blood and blood-forming organs and certain disorders involving the immune mechanism: Secondary | ICD-10-CM

## 2020-12-13 DIAGNOSIS — Z13220 Encounter for screening for lipoid disorders: Secondary | ICD-10-CM

## 2020-12-13 DIAGNOSIS — Z79899 Other long term (current) drug therapy: Secondary | ICD-10-CM | POA: Diagnosis not present

## 2020-12-13 DIAGNOSIS — Z124 Encounter for screening for malignant neoplasm of cervix: Secondary | ICD-10-CM

## 2020-12-13 DIAGNOSIS — Z131 Encounter for screening for diabetes mellitus: Secondary | ICD-10-CM

## 2020-12-13 DIAGNOSIS — Z0000 Encounter for general adult medical examination without abnormal findings: Secondary | ICD-10-CM | POA: Diagnosis not present

## 2020-12-13 LAB — COMPREHENSIVE METABOLIC PANEL
ALT: 12 U/L (ref 0–35)
AST: 14 U/L (ref 0–37)
Albumin: 4.5 g/dL (ref 3.5–5.2)
Alkaline Phosphatase: 35 U/L — ABNORMAL LOW (ref 39–117)
BUN: 10 mg/dL (ref 6–23)
CO2: 30 mEq/L (ref 19–32)
Calcium: 9.3 mg/dL (ref 8.4–10.5)
Chloride: 103 mEq/L (ref 96–112)
Creatinine, Ser: 0.58 mg/dL (ref 0.40–1.20)
GFR: 114.9 mL/min (ref >60.00)
Glucose, Bld: 78 mg/dL (ref 70–99)
Potassium: 4 mEq/L (ref 3.5–5.1)
Sodium: 137 mEq/L (ref 135–145)
Total Bilirubin: 0.7 mg/dL (ref 0.2–1.2)
Total Protein: 6.6 g/dL (ref 6.0–8.3)

## 2020-12-13 LAB — CBC WITH DIFFERENTIAL/PLATELET
Basophils Absolute: 0 10*3/uL (ref 0.0–0.1)
Basophils Relative: 0.6 % (ref 0.0–3.0)
Eosinophils Absolute: 0.1 10*3/uL (ref 0.0–0.7)
Eosinophils Relative: 1.5 % (ref 0.0–5.0)
HCT: 39.5 % (ref 36.0–46.0)
Hemoglobin: 13.6 g/dL (ref 12.0–15.0)
Lymphocytes Relative: 30.3 % (ref 12.0–46.0)
Lymphs Abs: 2.2 10*3/uL (ref 0.7–4.0)
MCHC: 34.4 g/dL (ref 30.0–36.0)
MCV: 87.3 fl (ref 78.0–100.0)
Monocytes Absolute: 0.5 10*3/uL (ref 0.1–1.0)
Monocytes Relative: 7.2 % (ref 3.0–12.0)
Neutro Abs: 4.5 10*3/uL (ref 1.4–7.7)
Neutrophils Relative %: 60.4 % (ref 43.0–77.0)
Platelets: 322 10*3/uL (ref 150.0–400.0)
RBC: 4.53 Mil/uL (ref 3.87–5.11)
RDW: 12.6 % (ref 11.5–15.5)
WBC: 7.4 10*3/uL (ref 4.0–10.5)

## 2020-12-13 LAB — LIPID PANEL
Cholesterol: 144 mg/dL (ref 0–200)
HDL: 55.2 mg/dL (ref >39.00)
LDL Cholesterol: 78 mg/dL (ref 0–99)
NonHDL: 89.17
Total CHOL/HDL Ratio: 3
Triglycerides: 56 mg/dL (ref 0.0–149.0)
VLDL: 11.2 mg/dL (ref 0.0–40.0)

## 2020-12-13 LAB — HEMOGLOBIN A1C: Hgb A1c MFr Bld: 5.1 % (ref 4.6–6.5)

## 2020-12-13 NOTE — Progress Notes (Addendum)
This visit occurred during the SARS-CoV-2 public health emergency.  Safety protocols were in place, including screening questions prior to the visit, additional usage of staff PPE, and extensive cleaning of exam room while observing appropriate contact time as indicated for disinfecting solutions.    Patient ID: Sheri Bray, female  DOB: [birth year], 40 y.o.   MRN: [id number] Patient Care Team    Relationship Specialty Notifications Start End  Natalia Leatherwood, DO PCP - General Family Medicine  08/01/20     Chief Complaint  Patient presents with  . Annual Exam    Pt is fasting    Subjective:  Sheri Bray is a 40 y.o.  Female  present for CPE. All past medical history, surgical history, allergies, family history, immunizations, medications and social history were updated in the electronic medical record today. All recent labs, ED visits and hospitalizations within the last year were reviewed.  She had covid in [place]. 2022. Mild symptoms.  Menstrual cycles have regulated.  She is supplementing vit d and b12 and feel symptoms have improved- hair loss has resolved.   Health maintenance:  Colonoscopy: no fhx. Start screen at 46 Mammogram: no fhx, start screen at 41 Cervical cancer screening: last pap: 2019, 5 yr. > referred to GYN Immunizations: tdap UTD 2017, Influenza UTD 2021 (encouraged yearly),covid series completed- booster completed Infectious disease screening: HIV completed  DEXA: lroutine screen Assistive device: none Oxygen WUJ:WJXB Patient has a Dental home. Hospitalizations/ED visits: reviewed  Depression screen [facility] 2/9 12/13/2020 08/01/2020  Decreased Interest 0 0  Down, Depressed, Hopeless 0 1  PHQ - 2 Score 0 1  Altered sleeping - 3  Tired, decreased energy - 1  Change in appetite - 1  Feeling bad or failure about yourself  - 2  Trouble concentrating - 0  Moving slowly or fidgety/restless - 2  Suicidal thoughts - 0  PHQ-9 Score - 10   GAD 7 : Generalized  Anxiety Score 08/01/2020  Nervous, Anxious, on Edge 2  Control/stop worrying 1  Worry too much - different things 2  Trouble relaxing 1  Restless 1  Easily annoyed or irritable 3  Afraid - awful might happen 3  Total GAD 7 Score 13    Immunization History  Administered Date(s) Administered  . Influenza Inj Mdck Quad Pf 08/15/2018  . Influenza Split 08/16/2011  . Influenza,inj,Quad PF,6+ Mos 08/01/2020  . Influenza-Unspecified 06/28/2013, 07/19/2016  . Moderna Sars-Covid-2 Vaccination 01/13/2020, 02/08/2020, 08/31/2020  . Tdap 04/22/2011, 04/26/2014, 06/21/2016    Past Medical History:  Diagnosis Date  . Chicken pox   . COVID-19 10/2020   mild symptoms  . Eruption cyst 08/01/2020  . GERD (gastroesophageal reflux disease)   . Hay fever   . HPV in female 2009  . 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 Milk-Related Compounds Nausea Only  . Sulfamethoxazole Rash   Past Surgical History:  Procedure Laterality Date  . CERVICAL CONE BIOPSY  2009  . LAPAROSCOPY    . MOUTH SURGERY    . TONSILLECTOMY  2001   Family History  Problem Relation Age of Onset  . Arthritis Mother   . Asthma Mother   . Depression Mother        Frontotemporal lobe dementia  . Diabetes Mother   . Thyroid disease Mother   . Hearing loss Mother   . Hypertension Father   . Hyperlipidemia Father   . Depression Maternal Grandmother   .  Stroke Maternal Grandmother   . Asthma Maternal Grandmother   . COPD Maternal Grandmother   . Lymphoma Maternal Grandfather   . Diabetes Paternal Grandmother   . Cancer Paternal Grandfather   . Leukemia Paternal Grandfather   . Hypertension Brother   . Hyperlipidemia Brother    Social History   Social History Narrative   Marital status/children/pets: Married.   Education/employment: Bachelor's degree. Employed as a travel Art therapist:      -Wears a bicycle helmet riding a bike: Yes     -smoke alarm in  the home:Yes     - wears seatbelt: Yes     - Feels safe in their relationships: Yes    Allergies as of 12/13/2020      Reactions   Milk-related Compounds Nausea Only   Sulfamethoxazole Rash      Medication List       Accurate as of December 13, 2020 10:22 AM. If you have any questions, ask your nurse or doctor.        ELDERBERRY PO Take by mouth.   fluticasone 50 MCG/ACT nasal spray Commonly known as: FLONASE Place 2 sprays into both nostrils at bedtime.   MAG GLYCINATE PO Take 400 mg by mouth.   Melatonin Gummies 2.5 MG Chew Chew by mouth.   multivitamin capsule Take 1 capsule by mouth daily.   NexIUM 24HR 20 MG capsule Generic drug: esomeprazole 1 capsule   NON FORMULARY Sleep aid   vitamin B-12 1000 MCG tablet Commonly known as: CYANOCOBALAMIN Take 1,000 mcg by mouth daily.       All past medical history, surgical history, allergies, family history, immunizations andmedications were updated in the EMR today and reviewed under the history and medication portions of their EMR.     No results found for this or any previous visit (from the past 2160 hour(s)).  No results found.   ROS: 14 pt review of systems performed and negative (unless mentioned in an HPI)  Objective: BP 109/72   Pulse 92   Temp 98.6 F (37 C) (Oral)   Ht 5\' 3"  (1.6 m)   Wt 130 lb (59 kg)   SpO2 99%   BMI 23.03 kg/m  Gen: Afebrile. No acute distress. Nontoxic in appearance, well-developed, well-nourished,  Very pleasant female.  HENT: AT. Sheri Bray. Bilateral TM visualized and normal in appearance, normal external auditory canal. MMM, no oral lesions, adequate dentition. Bilateral nares within normal limits. Throat without erythema, ulcerations or exudates. no Cough on exam, no hoarseness on exam. Eyes:Pupils Equal Round Reactive to light, Extraocular movements intact,  Conjunctiva without redness, discharge or icterus. Neck/lymp/endocrine: Supple,no lymphadenopathy, no thyromegaly CV:  RRR no murmur, no edema, +2/4 P posterior tibialis pulses.  Chest: CTAB, no wheeze, rhonchi or crackles. normal Respiratory effort. good Air movement. Abd: Soft. flat. NTND. BS present. no Masses palpated. No hepatosplenomegaly. No rebound tenderness or guarding. Skin: no rashes, purpura or petechiae. Warm and well-perfused. Skin intact. Neuro/Msk:  Normal gait. PERLA. EOMi. Alert. Oriented x3.  Cranial nerves II through XII intact. Muscle strength 5/5 upper/lower extremity. DTRs equal bilaterally. Psych: Normal affect, dress and demeanor. Normal speech. Normal thought content and judgment.   No exam data present  Assessment/plan: ALOISE COPUS is a 40 y.o. female present for CPE  Lipid screening - Lipid panel Diabetes mellitus screening - Hemoglobin A1c Encounter for long-term current use of medication - Comprehensive metabolic panel Screening for deficiency anemia - CBC with Differential/Platelet Routine general medical examination  at a health care facility Patient was encouraged to exercise greater than 150 minutes a week. Patient was encouraged to choose a diet filled with fresh fruits and vegetables, and lean meats. AVS provided to patient today for education/recommendation on gender specific health and safety maintenance. Colonoscopy: no fhx. Start screen at 46 Mammogram: no fhx, start screen at 40 Cervical cancer screening: last pap: 2019, 5 yr. > referred to GYN Immunizations: tdap UTD 2017, Influenza UTD 2021 (encouraged yearly),covid series completed- booster completed Infectious disease screening: HIV completed  DEXA: lroutine screen  Return in about 1 year (around 12/13/2021) for CPE (30 min).  Orders Placed This Encounter  Procedures  . CBC with Differential/Platelet  . Comprehensive metabolic panel  . Hemoglobin A1c  . Lipid panel  . Ambulatory referral to Gynecology    Orders Placed This Encounter  Procedures  . CBC with Differential/Platelet  .  Comprehensive metabolic panel  . Hemoglobin A1c  . Lipid panel  . Ambulatory referral to Gynecology   No orders of the defined types were placed in this encounter.   Referral Orders     Ambulatory referral to Gynecology   Electronically signed by: Felix Pacini, DO [facility]

## 2020-12-13 NOTE — Patient Instructions (Signed)
Health Maintenance, Female Adopting a healthy lifestyle and getting preventive care are important in promoting health and wellness. Ask your health care provider about:  The right schedule for you to have regular tests and exams.  Things you can do on your own to prevent diseases and keep yourself healthy. What should I know about diet, weight, and exercise? Eat a healthy diet  Eat a diet that includes plenty of vegetables, fruits, low-fat dairy products, and lean protein.  Do not eat a lot of foods that are high in solid fats, added sugars, or sodium.   Maintain a healthy weight Body mass index (BMI) is used to identify weight problems. It estimates body fat based on height and weight. Your health care provider can help determine your BMI and help you achieve or maintain a healthy weight. Get regular exercise Get regular exercise. This is one of the most important things you can do for your health. Most adults should:  Exercise for at least 150 minutes each week. The exercise should increase your heart rate and make you sweat (moderate-intensity exercise).  Do strengthening exercises at least twice a week. This is in addition to the moderate-intensity exercise.  Spend less time sitting. Even light physical activity can be beneficial. Watch cholesterol and blood lipids Have your blood tested for lipids and cholesterol at 39 years of age, then have this test every 5 years. Have your cholesterol levels checked more often if:  Your lipid or cholesterol levels are high.  You are older than 40 years of age.  You are at high risk for heart disease. What should I know about cancer screening? Depending on your health history and family history, you may need to have cancer screening at various ages. This may include screening for:  Breast cancer.  Cervical cancer.  Colorectal cancer.  Skin cancer.  Lung cancer. What should I know about heart disease, diabetes, and high blood  pressure? Blood pressure and heart disease  High blood pressure causes heart disease and increases the risk of stroke. This is more likely to develop in people who have high blood pressure readings, are of African descent, or are overweight.  Have your blood pressure checked: ? Every 3-5 years if you are 18-39 years of age. ? Every year if you are 40 years old or older. Diabetes Have regular diabetes screenings. This checks your fasting blood sugar level. Have the screening done:  Once every three years after age 40 if you are at a normal weight and have a low risk for diabetes.  More often and at a younger age if you are overweight or have a high risk for diabetes. What should I know about preventing infection? Hepatitis B If you have a higher risk for hepatitis B, you should be screened for this virus. Talk with your health care provider to find out if you are at risk for hepatitis B infection. Hepatitis C Testing is recommended for:  Everyone born from 1945 through 1965.  Anyone with known risk factors for hepatitis C. Sexually transmitted infections (STIs)  Get screened for STIs, including gonorrhea and chlamydia, if: ? You are sexually active and are younger than 39 years of age. ? You are older than 39 years of age and your health care provider tells you that you are at risk for this type of infection. ? Your sexual activity has changed since you were last screened, and you are at increased risk for chlamydia or gonorrhea. Ask your health care provider   if you are at risk.  Ask your health care provider about whether you are at high risk for HIV. Your health care provider may recommend a prescription medicine to help prevent HIV infection. If you choose to take medicine to prevent HIV, you should first get tested for HIV. You should then be tested every 3 months for as long as you are taking the medicine. Pregnancy  If you are about to stop having your period (premenopausal) and  you may become pregnant, seek counseling before you get pregnant.  Take 400 to 800 micrograms (mcg) of folic acid every day if you become pregnant.  Ask for birth control (contraception) if you want to prevent pregnancy. Osteoporosis and menopause Osteoporosis is a disease in which the bones lose minerals and strength with aging. This can result in bone fractures. If you are 65 years old or older, or if you are at risk for osteoporosis and fractures, ask your health care provider if you should:  Be screened for bone loss.  Take a calcium or vitamin D supplement to lower your risk of fractures.  Be given hormone replacement therapy (HRT) to treat symptoms of menopause. Follow these instructions at home: Lifestyle  Do not use any products that contain nicotine or tobacco, such as cigarettes, e-cigarettes, and chewing tobacco. If you need help quitting, ask your health care provider.  Do not use street drugs.  Do not share needles.  Ask your health care provider for help if you need support or information about quitting drugs. Alcohol use  Do not drink alcohol if: ? Your health care provider tells you not to drink. ? You are pregnant, may be pregnant, or are planning to become pregnant.  If you drink alcohol: ? Limit how much you use to 0-1 drink a day. ? Limit intake if you are breastfeeding.  Be aware of how much alcohol is in your drink. In the [place] one drink equals one 12 oz bottle of beer (355 mL), one 5 oz glass of wine (148 mL), or one 1 oz glass of hard liquor (44 mL). General instructions  Schedule regular health, dental, and eye exams.  Stay current with your vaccines.  Tell your health care provider if: ? You often feel depressed. ? You have ever been abused or do not feel safe at home. Summary  Adopting a healthy lifestyle and getting preventive care are important in promoting health and wellness.  Follow your health care provider's instructions about healthy  diet, exercising, and getting tested or screened for diseases.  Follow your health care provider's instructions on monitoring your cholesterol and blood pressure. This information is not intended to replace advice given to you by your health care provider. Make sure you discuss any questions you have with your health care provider. Document Revised: 10/07/2018 Document Reviewed: 10/07/2018 Elsevier Patient Education  2021 Elsevier Inc.  

## 2020-12-13 NOTE — Addendum Note (Signed)
Addended by: Felix Pacini A on: 12/13/2020 10:22 AM   Modules accepted: Orders

## 2021-01-22 ENCOUNTER — Encounter: Payer: Self-pay | Admitting: Nurse Practitioner

## 2021-01-23 ENCOUNTER — Encounter: Payer: Self-pay | Admitting: Nurse Practitioner

## 2021-01-23 ENCOUNTER — Ambulatory Visit: Payer: PRIVATE HEALTH INSURANCE | Admitting: Nurse Practitioner

## 2021-01-23 ENCOUNTER — Other Ambulatory Visit: Payer: Self-pay

## 2021-01-23 VITALS — BP 112/60 | Ht 63.0 in | Wt 133.0 lb

## 2021-01-23 DIAGNOSIS — Z01419 Encounter for gynecological examination (general) (routine) without abnormal findings: Secondary | ICD-10-CM | POA: Diagnosis not present

## 2021-01-23 DIAGNOSIS — N644 Mastodynia: Secondary | ICD-10-CM | POA: Diagnosis not present

## 2021-01-23 NOTE — Patient Instructions (Signed)
Health Maintenance, Female Adopting a healthy lifestyle and getting preventive care are important in promoting health and wellness. Ask your health care provider about:  The right schedule for you to have regular tests and exams.  Things you can do on your own to prevent diseases and keep yourself healthy. What should I know about diet, weight, and exercise? Eat a healthy diet  Eat a diet that includes plenty of vegetables, fruits, low-fat dairy products, and lean protein.  Do not eat a lot of foods that are high in solid fats, added sugars, or sodium.   Maintain a healthy weight Body mass index (BMI) is used to identify weight problems. It estimates body fat based on height and weight. Your health care provider can help determine your BMI and help you achieve or maintain a healthy weight. Get regular exercise Get regular exercise. This is one of the most important things you can do for your health. Most adults should:  Exercise for at least 150 minutes each week. The exercise should increase your heart rate and make you sweat (moderate-intensity exercise).  Do strengthening exercises at least twice a week. This is in addition to the moderate-intensity exercise.  Spend less time sitting. Even light physical activity can be beneficial. Watch cholesterol and blood lipids Have your blood tested for lipids and cholesterol at 39 years of age, then have this test every 5 years. Have your cholesterol levels checked more often if:  Your lipid or cholesterol levels are high.  You are older than 40 years of age.  You are at high risk for heart disease. What should I know about cancer screening? Depending on your health history and family history, you may need to have cancer screening at various ages. This may include screening for:  Breast cancer.  Cervical cancer.  Colorectal cancer.  Skin cancer.  Lung cancer. What should I know about heart disease, diabetes, and high blood  pressure? Blood pressure and heart disease  High blood pressure causes heart disease and increases the risk of stroke. This is more likely to develop in people who have high blood pressure readings, are of African descent, or are overweight.  Have your blood pressure checked: ? Every 3-5 years if you are 18-39 years of age. ? Every year if you are 40 years old or older. Diabetes Have regular diabetes screenings. This checks your fasting blood sugar level. Have the screening done:  Once every three years after age 40 if you are at a normal weight and have a low risk for diabetes.  More often and at a younger age if you are overweight or have a high risk for diabetes. What should I know about preventing infection? Hepatitis B If you have a higher risk for hepatitis B, you should be screened for this virus. Talk with your health care provider to find out if you are at risk for hepatitis B infection. Hepatitis C Testing is recommended for:  Everyone born from 1945 through 1965.  Anyone with known risk factors for hepatitis C. Sexually transmitted infections (STIs)  Get screened for STIs, including gonorrhea and chlamydia, if: ? You are sexually active and are younger than 39 years of age. ? You are older than 39 years of age and your health care provider tells you that you are at risk for this type of infection. ? Your sexual activity has changed since you were last screened, and you are at increased risk for chlamydia or gonorrhea. Ask your health care provider   if you are at risk.  Ask your health care provider about whether you are at high risk for HIV. Your health care provider may recommend a prescription medicine to help prevent HIV infection. If you choose to take medicine to prevent HIV, you should first get tested for HIV. You should then be tested every 3 months for as long as you are taking the medicine. Pregnancy  If you are about to stop having your period (premenopausal) and  you may become pregnant, seek counseling before you get pregnant.  Take 400 to 800 micrograms (mcg) of folic acid every day if you become pregnant.  Ask for birth control (contraception) if you want to prevent pregnancy. Osteoporosis and menopause Osteoporosis is a disease in which the bones lose minerals and strength with aging. This can result in bone fractures. If you are 65 years old or older, or if you are at risk for osteoporosis and fractures, ask your health care provider if you should:  Be screened for bone loss.  Take a calcium or vitamin D supplement to lower your risk of fractures.  Be given hormone replacement therapy (HRT) to treat symptoms of menopause. Follow these instructions at home: Lifestyle  Do not use any products that contain nicotine or tobacco, such as cigarettes, e-cigarettes, and chewing tobacco. If you need help quitting, ask your health care provider.  Do not use street drugs.  Do not share needles.  Ask your health care provider for help if you need support or information about quitting drugs. Alcohol use  Do not drink alcohol if: ? Your health care provider tells you not to drink. ? You are pregnant, may be pregnant, or are planning to become pregnant.  If you drink alcohol: ? Limit how much you use to 0-1 drink a day. ? Limit intake if you are breastfeeding.  Be aware of how much alcohol is in your drink. In the [place] one drink equals one 12 oz bottle of beer (355 mL), one 5 oz glass of wine (148 mL), or one 1 oz glass of hard liquor (44 mL). General instructions  Schedule regular health, dental, and eye exams.  Stay current with your vaccines.  Tell your health care provider if: ? You often feel depressed. ? You have ever been abused or do not feel safe at home. Summary  Adopting a healthy lifestyle and getting preventive care are important in promoting health and wellness.  Follow your health care provider's instructions about healthy  diet, exercising, and getting tested or screened for diseases.  Follow your health care provider's instructions on monitoring your cholesterol and blood pressure. This information is not intended to replace advice given to you by your health care provider. Make sure you discuss any questions you have with your health care provider. Document Revised: 10/07/2018 Document Reviewed: 10/07/2018 Elsevier Patient Education  2021 Elsevier Inc.  

## 2021-01-23 NOTE — Progress Notes (Signed)
   Sheri Bray [birth year] [id number]   History:  40 y.o. G2P2002 presents as new patient to establish care. Monthly cycles, no contraception. 2009 cone biopsy, subsequent paps normal. She has noticed left breast pain the past 2-3 months that occurs a few days before her menses. She describes the pain as sore, left outer breast to nipple. She lifts weights 4 x per week and thinks it may be related.  Gynecologic History Patient's last menstrual period was 01/02/2021. Period Cycle (Days): 30 Period Duration (Days): 5 Period Pattern: Regular Menstrual Flow: Moderate Dysmenorrhea: (!) Moderate Dysmenorrhea Symptoms: Cramping Contraception/Family planning: none  Health Maintenance Last Pap: 01/2016. Results were: normal Last mammogram: N/A Last colonoscopy: N/A Last Dexa: N/A  Past medical history, past surgical history, family history and social history were all reviewed and documented in the EPIC chart. [employer] travel [employer]. 59 yo daughter, 54 yo son.  ROS:  A ROS was performed and pertinent positives and negatives are included.  Exam:  Vitals:   01/23/21 0922  BP: 112/60  Weight: 133 lb (60.3 kg)  Height: 5\' 3"  (1.6 m)   Body mass index is 23.56 kg/m.  General appearance:  Normal Thyroid:  Symmetrical, normal in size, without palpable masses or nodularity. Respiratory  Auscultation:  Clear without wheezing or rhonchi Cardiovascular  Auscultation:  Regular rate, without rubs, murmurs or gallops  Edema/varicosities:  Not grossly evident Abdominal  Soft,nontender, without masses, guarding or rebound.  Liver/spleen:  No organomegaly noted  Hernia:  None appreciated  Skin  Inspection:  Grossly normal   Breasts: Examined lying and sitting.   Right: Without masses, retractions, discharge or axillary adenopathy.   Left: Without masses, retractions, discharge or axillary adenopathy. Gentitourinary   Inguinal/mons:  Normal without inguinal adenopathy  External genitalia:   Normal  BUS/Urethra/Skene's glands:  Normal  Vagina:  Normal  Cervix:  Reparative changes  Uterus:  Normal in size, shape and contour.  Midline and mobile  Adnexa/parametria:     Rt: Without masses or tenderness.   Lt: Without masses or tenderness.  Anus and perineum: Normal  Assessment/Plan:  40 y.o. 21 for annual exam.   Well female exam with routine gynecological exam - Education provided on SBEs, importance of preventative screenings, current guidelines, high calcium diet, regular exercise, and multivitamin daily. Labs with PCP.   Pain of left breast - She has noticed it the last 2-3 months a few days before her menses. She describes the pain as sore, left outer breast to nipple, lasts a few days. She lifts weights 4 x per week and thinks it may be related. We discussed the option to monitor versus breast ultrasound. She would like to monitor for a couple more months and if pain continues we will send referral. Normal breast exam today.   Screening for cervical cancer -  2009 cone biopsy, subsequent paps normal. Pap with HR HPV today.   Return in 1 year for annual.     2010 DNP, 9:37 AM 01/23/2021

## 2021-01-25 LAB — PAP, TP IMAGING W/ HPV RNA, RFLX HPV TYPE 16,18/45: HPV DNA High Risk: NOT DETECTED

## 2021-05-17 ENCOUNTER — Telehealth: Payer: Self-pay | Admitting: Family Medicine

## 2021-05-17 NOTE — Telephone Encounter (Signed)
Pt do not have records of immunizations. Pt sched for OV with provider for titers. Please advise if okay to continue with TB sched on Monday.

## 2021-05-17 NOTE — Telephone Encounter (Signed)
Patient dropped off health exam certificate.

## 2021-05-17 NOTE — Telephone Encounter (Signed)
Patient dropped off health exam certificate. Placed in Dr. Alan Ripper front office inbox to be filled out. Patient will pick completed form at her upcoming appt 05/21/21.

## 2021-05-17 NOTE — Telephone Encounter (Signed)
Form received

## 2021-05-18 NOTE — Telephone Encounter (Signed)
Form placed on my desk until pt appt

## 2021-05-18 NOTE — Telephone Encounter (Signed)
Okay to use proceed with PPD testing.  Form completed from physicians perspective.  Immunization box will need to be filled out once results received.

## 2021-05-21 ENCOUNTER — Ambulatory Visit (INDEPENDENT_AMBULATORY_CARE_PROVIDER_SITE_OTHER): Payer: No Typology Code available for payment source

## 2021-05-21 ENCOUNTER — Other Ambulatory Visit: Payer: Self-pay

## 2021-05-21 DIAGNOSIS — Z111 Encounter for screening for respiratory tuberculosis: Secondary | ICD-10-CM | POA: Diagnosis not present

## 2021-05-23 ENCOUNTER — Ambulatory Visit (INDEPENDENT_AMBULATORY_CARE_PROVIDER_SITE_OTHER): Payer: No Typology Code available for payment source | Admitting: Family Medicine

## 2021-05-23 ENCOUNTER — Encounter: Payer: Self-pay | Admitting: Family Medicine

## 2021-05-23 ENCOUNTER — Other Ambulatory Visit: Payer: Self-pay

## 2021-05-23 VITALS — BP 104/70 | HR 75 | Temp 98.3°F | Ht 63.0 in | Wt 143.0 lb

## 2021-05-23 DIAGNOSIS — Z111 Encounter for screening for respiratory tuberculosis: Secondary | ICD-10-CM

## 2021-05-23 DIAGNOSIS — Z0184 Encounter for antibody response examination: Secondary | ICD-10-CM

## 2021-05-23 LAB — TB SKIN TEST
Induration: 0 mm
TB Skin Test: NEGATIVE

## 2021-05-23 NOTE — Patient Instructions (Signed)
We will call you with lab results.  Testing for hep B immunity and MMR.

## 2021-05-23 NOTE — Progress Notes (Signed)
This visit occurred during the SARS-CoV-2 public health emergency.  Safety protocols were in place, including screening questions prior to the visit, additional usage of staff PPE, and extensive cleaning of exam room while observing appropriate contact time as indicated for disinfecting solutions.    Sheri Bray , [birth year], 40 y.o., female MRN: [id number] Patient Care Team    Relationship Specialty Notifications Start End  Ma Hillock, DO PCP - General Family Medicine  08/01/20     Chief Complaint  Patient presents with   Immunizations     Subjective: Pt presents for an OV in need of immunity status to hep B and MMR for her employer. Her PPD has been read as negative 05/21/2021. Her tdap is UTD 06/21/2016. She is uncertain if she had the hep B series. She did receive MMR 10/01/1984 and 06/19/2020. She reports they moved around when she was a kid 2/2 to parent in the [employer]. She has since lost track of her complete immunization record.   Depression screen [facility] 2/9 12/13/2020 08/01/2020  Decreased Interest 0 0  Down, Depressed, Hopeless 0 1  PHQ - 2 Score 0 1  Altered sleeping - 3  Tired, decreased energy - 1  Change in appetite - 1  Feeling bad or failure about yourself  - 2  Trouble concentrating - 0  Moving slowly or fidgety/restless - 2  Suicidal thoughts - 0  PHQ-9 Score - 10    Allergies  Allergen Reactions   Milk-Related Compounds Nausea Only   Sulfamethoxazole Rash   Social History   Social History Narrative   Marital status/children/pets: Married.   Education/employment: Bachelor's degree. 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   Past Surgical History:  Procedure Laterality Date   CERVICAL CONE BIOPSY  2009   LAPAROSCOPY     MOUTH SURGERY     TONSILLECTOMY  2001   Family History  Problem Relation Age of Onset   Arthritis Mother    Asthma Mother    Depression Mother        Frontotemporal lobe dementia   Diabetes Mother    Thyroid disease Mother    Hearing loss Mother    Hypertension Father    Hyperlipidemia Father    Depression Maternal Grandmother    Stroke Maternal Grandmother    Asthma Maternal Grandmother    COPD Maternal Grandmother    Lymphoma Maternal Grandfather    Diabetes Paternal Grandmother    Cancer Paternal Grandfather    Leukemia Paternal Grandfather    Hypertension Brother    Hyperlipidemia Brother    Allergies as of 05/23/2021       Reactions   Milk-related Compounds Nausea Only   Sulfamethoxazole Rash        Medication List        Accurate as of May 23, 2021  4:57 PM. If you have any questions, ask your nurse or doctor.  esomeprazole 20 MG capsule Commonly known as: NEXIUM 1 capsule   fluticasone 50 MCG/ACT nasal spray Commonly known as: FLONASE Place 2 sprays into both nostrils at bedtime.   MAG GLYCINATE PO Take 400 mg by mouth.   Melatonin Gummies 2.5 MG Chew Chew by mouth.   multivitamin capsule Take 1 capsule by mouth daily.   NON FORMULARY Sleep aid   vitamin B-12 1000 MCG tablet Commonly known as: CYANOCOBALAMIN Take 1,000 mcg by mouth daily.   VITAMIN D PO Take by mouth.   ZYRTEC PO Take by mouth.        All past medical history, surgical history, allergies, family history, immunizations andmedications were updated in the EMR today and reviewed under the history and medication portions of their EMR.     ROS: Negative, with the exception of above mentioned in HPI   Objective:  BP 104/70   Pulse 75   Temp 98.3 F (36.8 C) (Oral)   Ht 5' 3"   (1.6 m)   Wt 143 lb (64.9 kg)   SpO2 97%   BMI 25.33 kg/m  Body mass index is 25.33 kg/m. Gen: Afebrile. No acute distress. Nontoxic in appearance, well developed, well nourished.  HENT: AT. [place]. No cough. No hoarseness.  Eyes:Pupils Equal Round Reactive to light, Extraocular movements intact,  Conjunctiva without redness, discharge or icterus. Neuro: Normal gait. PERLA. EOMi. Alert. Oriented x3  Psych: Normal affect, dress and demeanor. Normal speech. Normal thought content and judgment.  No results found. No results found. Results for orders placed or performed in visit on 05/21/21 (from the past 24 hour(s))  TB Skin Test     Status: Normal   Collection Time: 05/23/21  3:46 PM  Result Value Ref Range   TB Skin Test Negative    Induration 0 mm    Assessment/Plan: Sheri Bray is a 40 y.o. female present for OV for  Immunity status testing Once results received will print out immunization records we have and immunity status to labs collected today.  I encouraged her to keep them on file for herself.  I have also asked her to clarify with employer that it is required to have hep B series .  - Measles/Mumps/Rubella Immunity - Hepatitis B surface antibody,qualitative  Tuberculosis screening - Read PPD> negative  Reviewed expectations re: course of current medical issues. Discussed self-management of symptoms. Outlined signs and symptoms indicating need for more acute intervention. Patient verbalized understanding and all questions were answered. Patient received an After-Visit Summary.    Orders Placed This Encounter  Procedures   Measles/Mumps/Rubella Immunity   Hepatitis B surface antibody,qualitative   No orders of the defined types were placed in this encounter.  Referral Orders  No referral(s) requested today     Note is dictated utilizing voice recognition software. Although note has been proof read prior to signing, occasional typographical errors still can  be missed. If any questions arise, please do not hesitate to call for verification.   electronically signed by:  Howard Pouch, DO  [place]

## 2021-05-24 ENCOUNTER — Telehealth: Payer: Self-pay | Admitting: Family Medicine

## 2021-05-24 LAB — MEASLES/MUMPS/RUBELLA IMMUNITY
Mumps IgG: 26 AU/mL
Rubella: 7.29 Index
Rubeola IgG: 98.6 AU/mL

## 2021-05-24 LAB — HEPATITIS B SURFACE ANTIBODY,QUALITATIVE: Hep B S Ab: REACTIVE — AB

## 2021-05-24 NOTE — Telephone Encounter (Signed)
These inform patient she has immunity to both test performed. Immunity to measles mumps rubella and hepatitis B.  Please complete the form we have for her.    Also print off a immunization record and most recent 2 labs for her immunity prove to that she can keep these for her own records.  Thanks

## 2021-05-24 NOTE — Telephone Encounter (Signed)
Spoke with pt regarding labs and instructions.   

## 2021-05-25 NOTE — Telephone Encounter (Signed)
Forms placed at front desk and copy sent to scan

## 2021-05-28 NOTE — Telephone Encounter (Signed)
Faxed to [facility], fax confirmation received.

## 2021-12-14 ENCOUNTER — Other Ambulatory Visit: Payer: Self-pay

## 2021-12-14 ENCOUNTER — Ambulatory Visit (INDEPENDENT_AMBULATORY_CARE_PROVIDER_SITE_OTHER): Payer: No Typology Code available for payment source | Admitting: Family Medicine

## 2021-12-14 ENCOUNTER — Encounter: Payer: Self-pay | Admitting: Family Medicine

## 2021-12-14 VITALS — BP 100/66 | HR 69 | Temp 98.3°F | Ht 63.25 in | Wt 141.0 lb

## 2021-12-14 DIAGNOSIS — Z1159 Encounter for screening for other viral diseases: Secondary | ICD-10-CM

## 2021-12-14 DIAGNOSIS — Z13220 Encounter for screening for lipoid disorders: Secondary | ICD-10-CM

## 2021-12-14 DIAGNOSIS — Z0000 Encounter for general adult medical examination without abnormal findings: Secondary | ICD-10-CM

## 2021-12-14 DIAGNOSIS — Z131 Encounter for screening for diabetes mellitus: Secondary | ICD-10-CM | POA: Diagnosis not present

## 2021-12-14 DIAGNOSIS — Z130 Encounter for screening for diseases of the blood and blood-forming organs and certain disorders involving the immune mechanism: Secondary | ICD-10-CM | POA: Diagnosis not present

## 2021-12-14 LAB — COMPREHENSIVE METABOLIC PANEL
ALT: 13 U/L (ref 0–35)
AST: 16 U/L (ref 0–37)
Albumin: 4.7 g/dL (ref 3.5–5.2)
Alkaline Phosphatase: 39 U/L (ref 39–117)
BUN: 11 mg/dL (ref 6–23)
CO2: 32 mEq/L (ref 19–32)
Calcium: 9.3 mg/dL (ref 8.4–10.5)
Chloride: 103 mEq/L (ref 96–112)
Creatinine, Ser: 0.66 mg/dL (ref 0.40–1.20)
GFR: 110.6 mL/min (ref >60.00)
Glucose, Bld: 87 mg/dL (ref 70–99)
Potassium: 4.1 mEq/L (ref 3.5–5.1)
Sodium: 137 mEq/L (ref 135–145)
Total Bilirubin: 0.6 mg/dL (ref 0.2–1.2)
Total Protein: 6.5 g/dL (ref 6.0–8.3)

## 2021-12-14 LAB — CBC
HCT: 39.3 % (ref 36.0–46.0)
Hemoglobin: 13.2 g/dL (ref 12.0–15.0)
MCHC: 33.7 g/dL (ref 30.0–36.0)
MCV: 87.3 fl (ref 78.0–100.0)
Platelets: 335 10*3/uL (ref 150.0–400.0)
RBC: 4.5 Mil/uL (ref 3.87–5.11)
RDW: 12.7 % (ref 11.5–15.5)
WBC: 6.1 10*3/uL (ref 4.0–10.5)

## 2021-12-14 LAB — LIPID PANEL
Cholesterol: 147 mg/dL (ref 0–200)
HDL: 56.7 mg/dL (ref >39.00)
LDL Cholesterol: 79 mg/dL (ref 0–99)
NonHDL: 90.05
Total CHOL/HDL Ratio: 3
Triglycerides: 53 mg/dL (ref 0.0–149.0)
VLDL: 10.6 mg/dL (ref 0.0–40.0)

## 2021-12-14 LAB — HEMOGLOBIN A1C: Hgb A1c MFr Bld: 5.2 % (ref 4.6–6.5)

## 2021-12-14 NOTE — Progress Notes (Signed)
This visit occurred during the SARS-CoV-2 public health emergency.  Safety protocols were in place, including screening questions prior to the visit, additional usage of staff PPE, and extensive cleaning of exam room while observing appropriate contact time as indicated for disinfecting solutions.    Patient ID: Sheri Bray, female  DOB: 1981/09/17, 41 y.o.   MRN: [id number] Patient Care Team    Relationship Specialty Notifications Start End  Ma Hillock, DO PCP - General Family Medicine  08/01/20     Chief Complaint  Patient presents with   Annual Exam    Pt is fasting    Subjective:  Sheri Bray is a 41 y.o. female present for CPE. All past medical history, surgical history, allergies, family history, immunizations, medications and social history were updated in the electronic medical record today. All recent labs, ED visits and hospitalizations within the last year were reviewed.  Health maintenance:  Colonoscopy: no fhx. Start screen at 89 Mammogram: no fhx, start screen at 41 Cervical cancer screening: last pap: 01/23/2021, 5 yr. > GYN T. Juleen China, NP Immunizations: tdap UTD 2017, Influenza UTD 2022 (encouraged yearly),covid series completed- booster completed Infectious disease screening: HIV completed , Hep C agreeable to testing.  DEXA: routine screen at 61 Assistive device: none Oxygen PPJ:KDTO Patient has a Dental home. Hospitalizations/ED visits: reviewed  Depression screen [facility] 2/9 12/14/2021 12/13/2020 08/01/2020  Decreased Interest 0 0 0  Down, Depressed, Hopeless 0 0 1  PHQ - 2 Score 0 0 1  Altered sleeping - - 3  Tired, decreased energy - - 1  Change in appetite - - 1  Feeling bad or failure about yourself  - - 2  Trouble concentrating - - 0  Moving slowly or fidgety/restless - - 2  Suicidal thoughts - - 0  PHQ-9 Score - - 10   GAD 7 : Generalized Anxiety Score 08/01/2020  Nervous, Anxious, on Edge 2  Control/stop worrying 1  Worry too much - different  things 2  Trouble relaxing 1  Restless 1  Easily annoyed or irritable 3  Afraid - awful might happen 3  Total GAD 7 Score 13       No flowsheet data found.  Immunization History  Administered Date(s) Administered   DTaP 10/12/1982, 02/04/1983, 09/13/1983, 10/01/1984, 04/09/1988   IPV 10/12/1982, 02/04/1983, 09/13/1983, 10/02/1983   Influenza Inj Mdck Quad Pf 08/15/2018   Influenza Split 08/16/2011   Influenza,inj,Quad PF,6+ Mos 08/01/2020   Influenza-Unspecified 06/28/2013, 07/19/2016, 07/28/2021   MMR 10/01/1984, 06/19/2020   Moderna Sars-Covid-2 Vaccination 01/13/2020, 02/08/2020, 08/31/2020   PPD Test 05/21/2021   Pfizer Covid-19 Vaccine Bivalent Booster 4yr & up 09/12/2021   Td 05/31/2001   Tdap 04/22/2011, 04/26/2014, 06/21/2016     Past Medical History:  Diagnosis Date   Chicken pox    COVID-19 10/2020   mild symptoms   Eruption cyst 08/01/2020   GERD (gastroesophageal reflux disease)    Hay fever    HPV in female 2009   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   Allergies  Allergen Reactions   Milk-Related Compounds Nausea Only   Sulfamethoxazole Rash   Past Surgical History:  Procedure Laterality Date   CERVICAL CONE BIOPSY  2009   LAPAROSCOPY     MOUTH SURGERY     TONSILLECTOMY  2001   Family History  Problem Relation Age of Onset   Arthritis Mother    Asthma Mother    Depression Mother        Frontotemporal  lobe dementia   Diabetes Mother    Thyroid disease Mother    Hearing loss Mother    Hypertension Father    Hyperlipidemia Father    Depression Maternal Grandmother    Stroke Maternal Grandmother    Asthma Maternal Grandmother    COPD Maternal Grandmother    Lymphoma Maternal Grandfather    Diabetes Paternal Grandmother    Cancer Paternal Grandfather    Leukemia Paternal Grandfather    Hypertension Brother    Hyperlipidemia Brother    Social History   Social History Narrative   Marital status/children/pets:  Married.   Education/employment: Bachelor's degree. Employed as a travel Tax adviser:      -Wears a bicycle helmet riding a bike: Yes     -smoke alarm in the home:Yes     - wears seatbelt: Yes     - Feels safe in their relationships: Yes    Allergies as of 12/14/2021       Reactions   Milk-related Compounds Nausea Only   Sulfamethoxazole Rash        Medication List        Accurate as of December 14, 2021  8:35 AM. If you have any questions, ask your nurse or doctor.          STOP taking these medications    ZYRTEC PO Stopped by: Howard Pouch, DO       TAKE these medications    b complex vitamins capsule Take 1 capsule by mouth daily.   esomeprazole 20 MG capsule Commonly known as: NEXIUM 1 capsule   fluticasone 50 MCG/ACT nasal spray Commonly known as: FLONASE Place 2 sprays into both nostrils at bedtime.   Krill Oil 500 MG Caps Take by mouth.   levocetirizine 5 MG tablet Commonly known as: XYZAL Take 5 mg by mouth every evening.   MAG GLYCINATE PO Take 250 mg by mouth.   Melatonin Gummies 2.5 MG Chew Chew by mouth.   multivitamin capsule Take 1 capsule by mouth daily.   NON FORMULARY Sleep aid   vitamin B-12 1000 MCG tablet Commonly known as: CYANOCOBALAMIN Take 1,000 mcg by mouth daily.   VITAMIN D PO Take 1,000 Units by mouth.   zinc gluconate 50 MG tablet Take 50 mg by mouth daily.       All past medical history, surgical history, allergies, family history, immunizations andmedications were updated in the EMR today and reviewed under the history and medication portions of their EMR.     No results found for this or any previous visit (from the past 2160 hour(s)).  No results found.  ROS 14 pt review of systems performed and negative (unless mentioned in an HPI)  Objective: BP 100/66    Pulse 69    Temp 98.3 F (36.8 C) (Oral)    Ht 5' 3.25" (1.607 m)    Wt 141 lb (64 kg)    SpO2 100%    BMI 24.78 kg/m  Physical  Exam Vitals and nursing note reviewed.  Constitutional:      General: She is not in acute distress.    Appearance: Normal appearance. She is not ill-appearing or toxic-appearing.  HENT:     Head: Normocephalic and atraumatic.     Right Ear: Tympanic membrane, ear canal and external ear normal. There is no impacted cerumen.     Left Ear: Tympanic membrane, ear canal and external ear normal. There is no impacted cerumen.     Nose: No congestion  or rhinorrhea.     Mouth/Throat:     Mouth: Mucous membranes are moist.     Pharynx: Oropharynx is clear. No oropharyngeal exudate or posterior oropharyngeal erythema.  Eyes:     General: No scleral icterus.       Right eye: No discharge.        Left eye: No discharge.     Extraocular Movements: Extraocular movements intact.     Conjunctiva/sclera: Conjunctivae normal.     Pupils: Pupils are equal, round, and reactive to light.  Cardiovascular:     Rate and Rhythm: Normal rate and regular rhythm.     Pulses: Normal pulses.     Heart sounds: Normal heart sounds. No murmur heard.   No friction rub. No gallop.  Pulmonary:     Effort: Pulmonary effort is normal. No respiratory distress.     Breath sounds: Normal breath sounds. No stridor. No wheezing, rhonchi or rales.  Chest:     Chest wall: No tenderness.  Abdominal:     General: Abdomen is flat. Bowel sounds are normal. There is no distension.     Palpations: Abdomen is soft. There is no mass.     Tenderness: There is no abdominal tenderness. There is no right CVA tenderness, left CVA tenderness, guarding or rebound.     Hernia: No hernia is present.  Musculoskeletal:        General: No swelling, tenderness or deformity. Normal range of motion.     Cervical back: Normal range of motion and neck supple. No rigidity or tenderness.     Right lower leg: No edema.     Left lower leg: No edema.  Lymphadenopathy:     Cervical: No cervical adenopathy.  Skin:    General: Skin is warm and dry.      Coloration: Skin is not jaundiced or pale.     Findings: No bruising, erythema, lesion or rash.  Neurological:     General: No focal deficit present.     Mental Status: She is alert and oriented to person, place, and time. Mental status is at baseline.     Cranial Nerves: No cranial nerve deficit.     Sensory: No sensory deficit.     Motor: No weakness.     Coordination: Coordination normal.     Gait: Gait normal.     Deep Tendon Reflexes: Reflexes normal.  Psychiatric:        Mood and Affect: Mood normal.        Behavior: Behavior normal.        Thought Content: Thought content normal.        Judgment: Judgment normal.     No results found.  Assessment/plan: Sheri Bray is a 41 y.o. female present for CPE Screening for deficiency anemia - CBC Diabetes mellitus screening - Comprehensive metabolic panel - Hemoglobin A1c Lipid screening - Lipid panel Need for hepatitis C screening test - Hepatitis C Antibody Routine general medical examination at a health care facility Colonoscopy: no fhx. Start screen at 33 Mammogram: no fhx, start screen at 41 Cervical cancer screening: last pap: 01/23/2021, 5 yr. > GYN Immunizations: tdap UTD 2017, Influenza UTD 2022 (encouraged yearly),covid series completed- booster completed Infectious disease screening: HIV completed , Hep C agreeable to testing.  DEXA: routine screen at 61 Patient was encouraged to exercise greater than 150 minutes a week. Patient was encouraged to choose a diet filled with fresh fruits and vegetables, and lean meats. AVS provided to  patient today for education/recommendation on gender specific health and safety maintenance. Return in about 1 year (around 12/15/2022) for CPE (30 min).  Orders Placed This Encounter  Procedures   CBC   Comprehensive metabolic panel   Hemoglobin A1c   Lipid panel   Hepatitis C Antibody   Orders Placed This Encounter  Procedures   CBC   Comprehensive metabolic panel    Hemoglobin A1c   Lipid panel   Hepatitis C Antibody   No orders of the defined types were placed in this encounter.  Referral Orders  No referral(s) requested today     Note is dictated utilizing voice recognition software. Although note has been proof read prior to signing, occasional typographical errors still can be missed. If any questions arise, please do not hesitate to call for verification.  Electronically signed by: Howard Pouch, DO [place]

## 2021-12-14 NOTE — Patient Instructions (Addendum)
Health Maintenance, Female Adopting a healthy lifestyle and getting preventive care are important in promoting health and wellness. Ask your health care provider about: The right schedule for you to have regular tests and exams. Things you can do on your own to prevent diseases and keep yourself healthy. What should I know about diet, weight, and exercise? Eat a healthy diet  Eat a diet that includes plenty of vegetables, fruits, low-fat dairy products, and lean protein. Do not eat a lot of foods that are high in solid fats, added sugars, or sodium. Maintain a healthy weight Body mass index (BMI) is used to identify weight problems. It estimates body fat based on height and weight. Your health care provider can help determine your BMI and help you achieve or maintain a healthy weight. Get regular exercise Get regular exercise. This is one of the most important things you can do for your health. Most adults should: Exercise for at least 150 minutes each week. The exercise should increase your heart rate and make you sweat (moderate-intensity exercise). Do strengthening exercises at least twice a week. This is in addition to the moderate-intensity exercise. Spend less time sitting. Even light physical activity can be beneficial. Watch cholesterol and blood lipids Have your blood tested for lipids and cholesterol at 40 years of age, then have this test every 5 years. Have your cholesterol levels checked more often if: Your lipid or cholesterol levels are high. You are older than 41 years of age. You are at high risk for heart disease. What should I know about cancer screening? Depending on your health history and family history, you may need to have cancer screening at various ages. This may include screening for: Breast cancer. Cervical cancer. Colorectal cancer. Skin cancer. Lung cancer. What should I know about heart disease, diabetes, and high blood pressure? Blood pressure and heart  disease High blood pressure causes heart disease and increases the risk of stroke. This is more likely to develop in people who have high blood pressure readings or are overweight. Have your blood pressure checked: Every 3-5 years if you are 19-71 years of age. Every year if you are 29 years old or older. Diabetes Have regular diabetes screenings. This checks your fasting blood sugar level. Have the screening done: Once every three years after age 19 if you are at a normal weight and have a low risk for diabetes. More often and at a younger age if you are overweight or have a high risk for diabetes. What should I know about preventing infection? Hepatitis B If you have a higher risk for hepatitis B, you should be screened for this virus. Talk with your health care provider to find out if you are at risk for hepatitis B infection. Hepatitis C Testing is recommended for: Everyone born from [birth year] through 1964. Anyone with known risk factors for hepatitis C. Sexually transmitted infections (STIs) Get screened for STIs, including gonorrhea and chlamydia, if: You are sexually active and are younger than 40 years of age. You are older than 40 years of age and your health care provider tells you that you are at risk for this type of infection. Your sexual activity has changed since you were last screened, and you are at increased risk for chlamydia or gonorrhea. Ask your health care provider if you are at risk. Ask your health care provider about whether you are at high risk for HIV. Your health care provider may recommend a prescription medicine to help prevent HIV  infection. If you choose to take medicine to prevent HIV, you should first get tested for HIV. You should then be tested every 3 months for as long as you are taking the medicine. Pregnancy If you are about to stop having your period (premenopausal) and you may become pregnant, seek counseling before you get pregnant. Take 400 to 800  micrograms (mcg) of folic acid every day if you become pregnant. Ask for birth control (contraception) if you want to prevent pregnancy. Osteoporosis and menopause Osteoporosis is a disease in which the bones lose minerals and strength with aging. This can result in bone fractures. If you are 479 years old or older, or if you are at risk for osteoporosis and fractures, ask your health care provider if you should: Be screened for bone loss. Take a calcium or vitamin D supplement to lower your risk of fractures. Be given hormone replacement therapy (HRT) to treat symptoms of menopause. Follow these instructions at home: Alcohol use Do not drink alcohol if: Your health care provider tells you not to drink. You are pregnant, may be pregnant, or are planning to become pregnant. If you drink alcohol: Limit how much you have to: 0-1 drink a day. Know how much alcohol is in your drink. In the [place] one drink equals one 12 oz bottle of beer (355 mL), one 5 oz glass of wine (148 mL), or one 1 oz glass of hard liquor (44 mL). Lifestyle Do not use any products that contain nicotine or tobacco. These products include cigarettes, chewing tobacco, and vaping devices, such as e-cigarettes. If you need help quitting, ask your health care provider. Do not use street drugs. Do not share needles. Ask your health care provider for help if you need support or information about quitting drugs. General instructions Schedule regular health, dental, and eye exams. Stay current with your vaccines. Tell your health care provider if: You often feel depressed. You have ever been abused or do not feel safe at home. Summary Adopting a healthy lifestyle and getting preventive care are important in promoting health and wellness. Follow your health care provider's instructions about healthy diet, exercising, and getting tested or screened for diseases. Follow your health care provider's instructions on monitoring your  cholesterol and blood pressure. This information is not intended to replace advice given to you by your health care provider. Make sure you discuss any questions you have with your health care provider. Document Revised: 03/05/2021 Document Reviewed: 03/05/2021 Elsevier Patient Education  2022 [employer]   Hiatal Hernia A hiatal hernia occurs when part of the stomach slides above the muscle that separates the abdomen from the chest (diaphragm). A person can be born with a hiatal hernia (congenital), or it may develop over time. In almost all cases of hiatal hernia, only the top part of the stomach pushes through the diaphragm. Many people have a hiatal hernia with no symptoms. The larger the hernia, the more likely it is that you will have symptoms. In some cases, a hiatal hernia allows stomach acid to flow back into the tube that carries food from your mouth to your stomach (esophagus). This may cause heartburn symptoms. Severe heartburn symptoms may mean that you have developed a condition called gastroesophageal reflux disease (GERD). What are the causes? This condition is caused by a weakness in the opening (hiatus) where the esophagus passes through the diaphragm to attach to the upper part of the stomach. A person may be born with a weakness in the  hiatus, or a weakness can develop over time. What increases the risk? This condition is more likely to develop in: Older people. Age is a major risk factor for a hiatal hernia, especially if you are over the age of 68. Pregnant women. People who are overweight. People who have frequent constipation. What are the signs or symptoms? Symptoms of this condition usually develop in the form of GERD symptoms. Symptoms include: Heartburn. Belching. Indigestion. Trouble swallowing. Coughing or wheezing. Sore throat. Hoarseness. Chest pain. Nausea and vomiting. How is this diagnosed? This condition may be diagnosed during testing for GERD.  Tests that may be done include: X-rays of your stomach or chest. An upper gastrointestinal (GI) series. This is an X-ray exam of your GI tract that is taken after you swallow a chalky liquid that shows up clearly on the X-ray. Endoscopy. This is a procedure to look into your stomach using a thin, flexible tube that has a tiny camera and light on the end of it. How is this treated? This condition may be treated by: Dietary and lifestyle changes to help reduce GERD symptoms. Medicines. These may include: Over-the-counter antacids. Medicines that make your stomach empty more quickly. Medicines that block the production of stomach acid (H2 blockers). Stronger medicines to reduce stomach acid (proton pump inhibitors). Surgery to repair the hernia, if other treatments are not helping. If you have no symptoms, you may not need treatment. Follow these instructions at home: Lifestyle and activity Do not use any products that contain nicotine or tobacco, such as cigarettes and e-cigarettes. If you need help quitting, ask your health care provider. Try to achieve and maintain a healthy body weight. Avoid putting pressure on your abdomen. Anything that puts pressure on your abdomen increases the amount of acid that may be pushed up into your esophagus. Avoid bending over, especially after eating. Raise the head of your bed by putting blocks under the legs. This keeps your head and esophagus higher than your stomach. Do not wear tight clothing around your chest or stomach. Try not to strain when having a bowel movement, when urinating, or when lifting heavy objects. Eating and drinking Avoid foods that can worsen GERD symptoms. These may include: Fatty foods, like fried foods. Citrus fruits, like oranges or lemon. Other foods and drinks that contain acid, like orange juice or tomatoes. Spicy food. Chocolate. Eat frequent small meals instead of three large meals a day. This helps prevent your stomach  from getting too full. Eat slowly. Do not lie down right after eating. Do not eat 1-2 hours before bed. Do not drink beverages with caffeine. These include cola, coffee, cocoa, and tea. Do not drink alcohol. General instructions Take over-the-counter and prescription medicines only as told by your health care provider. Keep all follow-up visits as told by your health care provider. This is important. Contact a health care provider if: Your symptoms are not controlled with medicines or lifestyle changes. You are having trouble swallowing. You have coughing or wheezing that will not go away. Get help right away if: Your pain is getting worse. Your pain spreads to your arms, neck, jaw, teeth, or back. You have shortness of breath. You sweat for no reason. You feel sick to your stomach (nauseous) or you vomit. You vomit blood. You have bright red blood in your stools. You have black, tarry stools. Summary A hiatal hernia occurs when part of the stomach slides above the muscle that separates the abdomen from the chest (diaphragm). A person  may be born with a weakness in the hiatus, or a weakness can develop over time. Symptoms of hiatal hernia may include heartburn, trouble swallowing, or sore throat. Management of hiatal hernia includes eating frequent small meals instead of three large meals a day. Get help right away if you vomit blood, have bright red blood in your stools, or have black, tarry stools. This information is not intended to replace advice given to you by your health care provider. Make sure you discuss any questions you have with your health care provider. Document Revised: 09/14/2020 Document Reviewed: 09/14/2020 Elsevier Patient Education  2022 [employer].

## 2021-12-17 LAB — HEPATITIS C ANTIBODY
Hepatitis C Ab: NONREACTIVE
SIGNAL TO CUT-OFF: <0.02 (ref <1.00)

## 2022-01-23 NOTE — Progress Notes (Signed)
? ?  Sheri Bray 1981-05-12 [id number] ? ? ?History:  41 y.o. G2P2002 presents for annual visit. Monthly cycles. 2009 cone biopsy, subsequent paps normal. 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 She thinks it has returned because it is in the same area and feels raised.  ? ?Gynecologic History ?Patient's last menstrual period was 12/26/2021. ?Period Cycle (Days): 30 ?Period Duration (Days): 5 ?Period Pattern: Regular ?Menstrual Flow: Moderate ?Dysmenorrhea: (!) Moderate ?Dysmenorrhea Symptoms: Cramping ?Contraception/Family planning: none ?Sexually active: Yes ? ?Health Maintenance ?Last Pap: 01/23/2021. Results were: Normal, 3-year repeat ?Last mammogram: Not indicated ?Last colonoscopy: Not indicated ?Last Dexa: Not indicated ? ?Past medical history, past surgical history, family history and social history were all reviewed and documented in the EPIC chart. Married. 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 [employer]. 40 yo daughter, 40 yo son. ? ?ROS:  A ROS was performed and pertinent positives and negatives are included. ? ?Exam: ? ?Vitals:  ? 01/24/22 0835  ?BP: 122/76  ?Weight: 140 lb (63.5 kg)  ?Height: 5\' 3"  (1.6 m)  ? ? ?Body mass index is 24.8 kg/m?. ? ?General appearance:  Normal ?Thyroid:  Symmetrical, normal in size, without palpable masses or nodularity. ?Respiratory ? Auscultation:  Clear without wheezing or rhonchi ?Cardiovascular ? Auscultation:  Regular rate, without rubs, murmurs or gallops ? Edema/varicosities:  Not grossly evident ?Abdominal ? Soft,nontender, without masses, guarding or rebound. ? Liver/spleen:  No organomegaly noted ? Hernia:  None appreciated ? Skin ? Inspection:  Grossly normal ?  ?Breasts: Examined lying and sitting.  ? Right: Without masses, retractions, discharge or axillary adenopathy. ? ? Left: Without masses, retractions, discharge or axillary adenopathy. ?Genitourinary  ? Inguinal/mons:  Normal without inguinal adenopathy ? External genitalia:  Skin colored,  raised area at lower left introitus c./w condyloma ? BUS/Urethra/Skene's glands:  Normal ? Vagina:  Normal appearing with normal color and discharge, no lesions ? Cervix:  Normal appearing without discharge or lesions ? Uterus:  Normal in size, shape and contour.  Midline and mobile, nontender ? Adnexa/parametria:   ?  Rt: Normal in size, without masses or tenderness. ?  Lt: Normal in size, without masses or tenderness. ? Anus and perineum: Normal ? Digital rectal exam: Not indicated ? ?Patient informed chaperone available to be present for breast and pelvic exam. Patient has requested no chaperone to be present. Patient has been advised what will be completed during breast and pelvic exam.  ? ?Assessment/Plan:  41 y.o. 41 for annual exam.  ? ?Well female exam with routine gynecological exam - Education provided on SBEs, importance of preventative screenings, current guidelines, high calcium diet, regular exercise, and multivitamin daily. Labs with PCP. ? 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 These have returned in same area. She would like removed and will return for this. She is aware they may return again with topical treatment.  ? ?Screening for cervical cancer -  2009 cone biopsy, subsequent paps normal. Will repeat at 3-year interval per guidelines.  ? ?Return in 1 year for annual. ? ? ? ? ?2010 DNP, 9:39 AM 01/24/2022 ? ?

## 2022-01-24 ENCOUNTER — Ambulatory Visit (INDEPENDENT_AMBULATORY_CARE_PROVIDER_SITE_OTHER): Payer: No Typology Code available for payment source | Admitting: Nurse Practitioner

## 2022-01-24 ENCOUNTER — Encounter: Payer: Self-pay | Admitting: Nurse Practitioner

## 2022-01-24 ENCOUNTER — Ambulatory Visit: Payer: PRIVATE HEALTH INSURANCE | Admitting: Nurse Practitioner

## 2022-01-24 VITALS — BP 122/76 | Ht 63.0 in | Wt 140.0 lb

## 2022-01-24 DIAGNOSIS — A630 Anogenital (venereal) warts: Secondary | ICD-10-CM | POA: Diagnosis not present

## 2022-01-24 DIAGNOSIS — Z01419 Encounter for gynecological examination (general) (routine) without abnormal findings: Secondary | ICD-10-CM

## 2022-02-18 ENCOUNTER — Ambulatory Visit: Payer: No Typology Code available for payment source | Admitting: Nurse Practitioner

## 2022-12-16 ENCOUNTER — Ambulatory Visit (INDEPENDENT_AMBULATORY_CARE_PROVIDER_SITE_OTHER): Payer: BC Managed Care – PPO | Admitting: Family Medicine

## 2022-12-16 ENCOUNTER — Encounter: Payer: Self-pay | Admitting: Family Medicine

## 2022-12-16 DIAGNOSIS — Z131 Encounter for screening for diabetes mellitus: Secondary | ICD-10-CM | POA: Diagnosis not present

## 2022-12-16 DIAGNOSIS — Z130 Encounter for screening for diseases of the blood and blood-forming organs and certain disorders involving the immune mechanism: Secondary | ICD-10-CM

## 2022-12-16 DIAGNOSIS — Z1231 Encounter for screening mammogram for malignant neoplasm of breast: Secondary | ICD-10-CM

## 2022-12-16 DIAGNOSIS — Z13220 Encounter for screening for lipoid disorders: Secondary | ICD-10-CM | POA: Diagnosis not present

## 2022-12-16 DIAGNOSIS — Z0000 Encounter for general adult medical examination without abnormal findings: Secondary | ICD-10-CM

## 2022-12-16 LAB — COMPREHENSIVE METABOLIC PANEL
ALT: 20 U/L (ref 0–35)
AST: 20 U/L (ref 0–37)
Albumin: 4.3 g/dL (ref 3.5–5.2)
Alkaline Phosphatase: 39 U/L (ref 39–117)
BUN: 13 mg/dL (ref 6–23)
CO2: 29 mEq/L (ref 19–32)
Calcium: 9.2 mg/dL (ref 8.4–10.5)
Chloride: 104 mEq/L (ref 96–112)
Creatinine, Ser: 0.64 mg/dL (ref 0.40–1.20)
GFR: 110.64 mL/min (ref >60.00)
Glucose, Bld: 89 mg/dL (ref 70–99)
Potassium: 3.9 mEq/L (ref 3.5–5.1)
Sodium: 138 mEq/L (ref 135–145)
Total Bilirubin: 0.6 mg/dL (ref 0.2–1.2)
Total Protein: 6.4 g/dL (ref 6.0–8.3)

## 2022-12-16 LAB — CBC
HCT: 39.7 % (ref 36.0–46.0)
Hemoglobin: 13.3 g/dL (ref 12.0–15.0)
MCHC: 33.6 g/dL (ref 30.0–36.0)
MCV: 88.8 fl (ref 78.0–100.0)
Platelets: 339 10*3/uL (ref 150.0–400.0)
RBC: 4.47 Mil/uL (ref 3.87–5.11)
RDW: 12.9 % (ref 11.5–15.5)
WBC: 6.5 10*3/uL (ref 4.0–10.5)

## 2022-12-16 LAB — LIPID PANEL
Cholesterol: 139 mg/dL (ref 0–200)
HDL: 53.6 mg/dL (ref >39.00)
LDL Cholesterol: 69 mg/dL (ref 0–99)
NonHDL: 85.67
Total CHOL/HDL Ratio: 3
Triglycerides: 85 mg/dL (ref 0.0–149.0)
VLDL: 17 mg/dL (ref 0.0–40.0)

## 2022-12-16 LAB — HEMOGLOBIN A1C: Hgb A1c MFr Bld: 5.2 % (ref 4.6–6.5)

## 2022-12-16 NOTE — Patient Instructions (Addendum)
Return in about 1 year (around 12/18/2023) for cpe (20 min).        Great to see you today.  I have refilled the medication(s) we provide.   If labs were collected, we will inform you of lab results once received either by echart message or telephone call.   - echart message- for normal results that have been seen by the patient already.   - telephone call: abnormal results or if patient has not viewed results in their echart.

## 2022-12-16 NOTE — Progress Notes (Signed)
Patient ID: BRISIA BILLING, female  DOB: 01-15-1981, 42 y.o.   MRN: [id number] Patient Care Team    Relationship Specialty Notifications Start End  Ma Hillock, DO PCP - General Family Medicine  08/01/20   Tamela Gammon, NP Nurse Practitioner Gynecology  12/14/21     Chief Complaint  Patient presents with   Annual Exam    Pt is fasting;    Subjective:  Sheri Bray is a 42 y.o. female present for CPE. All past medical history, surgical history, allergies, family history, immunizations, medications and social history were updated in the electronic medical record today. All recent labs, ED visits and hospitalizations within the last year were reviewed.  Health maintenance:  Colon cancer : no fhx. Start screen at 28. Mammogram: no fhx, ordered at [facility].  Cervical cancer screening: last pap: 01/23/2021, 5 yr. > GYN T. Juleen China, NP Immunizations: tdap UTD 2017, Influenza UTD 2023 (encouraged yearly),covid series completed- booster completed Infectious disease screening: HIV completed , Hep C completed   DEXA: routine screen Assistive device: none Oxygen [id number] Patient has a Dental home. Hospitalizations/ED visits: Reviewed     12/16/2022    8:12 AM 12/14/2021    8:11 AM 12/13/2020    9:30 AM 08/01/2020   10:35 AM  Depression screen PHQ 2/9  Decreased Interest 0 0 0 0  Down, Depressed, Hopeless 0 0 0 1  PHQ - 2 Score 0 0 0 1  Altered sleeping    3  Tired, decreased energy    1  Change in appetite    1  Feeling bad or failure about yourself     2  Trouble concentrating    0  Moving slowly or fidgety/restless    2  Suicidal thoughts    0  PHQ-9 Score    10      12/16/2022    8:12 AM 08/01/2020   10:35 AM  GAD 7 : Generalized Anxiety Score  Nervous, Anxious, on Edge 2 2  Control/stop worrying 3 1  Worry too much - different things 3 2  Trouble relaxing 3 1  Restless 0 1  Easily annoyed or irritable 3 3  Afraid - awful might happen 3 3  Total GAD 7 Score  17 13           No data to display          Immunization History  Administered Date(s) Administered   DTaP 10/12/1982, 02/04/1983, 09/13/1983, 10/01/1984, 04/09/1988   IPV 10/12/1982, 02/04/1983, 09/13/1983, 10/02/1983   Influenza Inj Mdck Quad Pf 08/15/2018   Influenza Split 08/16/2011   Influenza,inj,Quad PF,6+ Mos 08/01/2020, 07/28/2022   Influenza-Unspecified 06/28/2013, 07/19/2016, 07/28/2021   MMR 10/01/1984, 06/19/2020   Moderna Sars-Covid-2 Vaccination 01/13/2020, 02/08/2020, 08/31/2020   PPD Test 05/21/2021   Pfizer Covid-19 Vaccine Bivalent Booster 9yr & up 09/12/2021   Td 05/31/2001   Tdap 04/22/2011, 04/26/2014, 06/21/2016     Past Medical History:  Diagnosis Date   Blood type A-    Chicken pox    COVID-19 10/2020   mild symptoms   Eruption cyst 08/01/2020   GERD (gastroesophageal reflux disease)    Hay fever    HPV in female 2009   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   Allergies  Allergen Reactions   Milk-Related Compounds Nausea Only   Sulfamethoxazole Rash   Past Surgical History:  Procedure Laterality Date   CERVICAL CONE BIOPSY  2009   LAPAROSCOPY  MOUTH SURGERY     TONSILLECTOMY  2001   Family History  Problem Relation Age of Onset   Arthritis Mother    Asthma Mother    Depression Mother        Frontotemporal lobe dementia   Diabetes Mother    Thyroid disease Mother    Hearing loss Mother    Hypertension Father    Hyperlipidemia Father    Depression Maternal Grandmother    Stroke Maternal Grandmother    Asthma Maternal Grandmother    COPD Maternal Grandmother    Lymphoma Maternal Grandfather    Diabetes Paternal Grandmother    Cancer Paternal Grandfather    Leukemia Paternal Grandfather    Hypertension Brother    Hyperlipidemia Brother    Social History   Social History Narrative   Marital status/children/pets: Married.   Education/employment: Bachelor's degree. Employed as a travel Ship broker:      -Wears a bicycle helmet riding a bike: Yes     -smoke alarm in the home:Yes     - wears seatbelt: Yes     - Feels safe in their relationships: Yes    Allergies as of 12/16/2022       Reactions   Milk-related Compounds Nausea Only   Sulfamethoxazole Rash        Medication List        Accurate as of December 16, 2022  8:15 AM. If you have any questions, ask your nurse or doctor.          STOP taking these medications    esomeprazole 20 MG capsule Commonly known as: NEXIUM Stopped by: Howard Pouch, DO   zinc gluconate 50 MG tablet Stopped by: Howard Pouch, DO       TAKE these medications    b complex vitamins capsule Take 1 capsule by mouth daily.   cyanocobalamin 1000 MCG tablet Commonly known as: VITAMIN B12 Take 1,000 mcg by mouth daily.   fluticasone 50 MCG/ACT nasal spray Commonly known as: FLONASE Place 2 sprays into both nostrils at bedtime.   Krill Oil 500 MG Caps Take by mouth.   levocetirizine 5 MG tablet Commonly known as: XYZAL Take 5 mg by mouth every evening.   MAG GLYCINATE PO Take 250 mg by mouth.   Melatonin Gummies 2.5 MG Chew Chew by mouth.   VITAMIN D PO Take 1,000 Units by mouth.       All past medical history, surgical history, allergies, family history, immunizations andmedications were updated in the EMR today and reviewed under the history and medication portions of their EMR.     No results found for this or any previous visit (from the past 2160 hour(s)).  No results found.  ROS 14 pt review of systems performed and negative (unless mentioned in an HPI)  Objective: BP 107/71   Pulse 67   Temp 98 F (36.7 C)   Ht 5' 4.57" (1.64 m)   Wt 139 lb (63 kg)   SpO2 100%   BMI 23.44 kg/m  Physical Exam Vitals and nursing note reviewed.  Constitutional:      General: She is not in acute distress.    Appearance: Normal appearance. She is not ill-appearing or toxic-appearing.  HENT:     Head:  Normocephalic and atraumatic.     Right Ear: Tympanic membrane, ear canal and external ear normal. There is no impacted cerumen.     Left Ear: Tympanic membrane, ear canal and external ear normal. There  is no impacted cerumen.     Nose: No congestion or rhinorrhea.     Mouth/Throat:     Mouth: Mucous membranes are moist.     Pharynx: Oropharynx is clear. No oropharyngeal exudate or posterior oropharyngeal erythema.  Eyes:     General: No scleral icterus.       Right eye: No discharge.        Left eye: No discharge.     Extraocular Movements: Extraocular movements intact.     Conjunctiva/sclera: Conjunctivae normal.     Pupils: Pupils are equal, round, and reactive to light.  Cardiovascular:     Rate and Rhythm: Normal rate and regular rhythm.     Pulses: Normal pulses.     Heart sounds: Normal heart sounds. No murmur heard.    No friction rub. No gallop.  Pulmonary:     Effort: Pulmonary effort is normal. No respiratory distress.     Breath sounds: Normal breath sounds. No stridor. No wheezing, rhonchi or rales.  Chest:     Chest wall: No tenderness.  Abdominal:     General: Abdomen is flat. Bowel sounds are normal. There is no distension.     Palpations: Abdomen is soft. There is no mass.     Tenderness: There is no abdominal tenderness. There is no right CVA tenderness, left CVA tenderness, guarding or rebound.     Hernia: No hernia is present.  Musculoskeletal:        General: No swelling, tenderness or deformity. Normal range of motion.     Cervical back: Normal range of motion and neck supple. No rigidity or tenderness.     Right lower leg: No edema.     Left lower leg: No edema.  Lymphadenopathy:     Cervical: No cervical adenopathy.  Skin:    General: Skin is warm and dry.     Coloration: Skin is not jaundiced or pale.     Findings: No bruising, erythema, lesion or rash.  Neurological:     General: No focal deficit present.     Mental Status: She is alert and oriented  to person, place, and time. Mental status is at baseline.     Cranial Nerves: No cranial nerve deficit.     Sensory: No sensory deficit.     Motor: No weakness.     Coordination: Coordination normal.     Gait: Gait normal.     Deep Tendon Reflexes: Reflexes normal.  Psychiatric:        Mood and Affect: Mood normal.        Behavior: Behavior normal.        Thought Content: Thought content normal.        Judgment: Judgment normal.    No results found.  Assessment/plan: ZEVAEH ADCOX is a 42 y.o. female present for CPE Routine general medical examination at a health care facility Patient was encouraged to exercise greater than 150 minutes a week. Patient was encouraged to choose a diet filled with fresh fruits and vegetables, and lean meats. AVS provided to patient today for education/recommendation on gender specific health and safety maintenance.   Return in about 1 year (around 12/18/2023) for cpe (20 min).   Orders Placed This Encounter  Procedures   MM 3D SCREEN BREAST BILATERAL   Comprehensive metabolic panel   Hemoglobin A1c   Lipid panel   CBC   No orders of the defined types were placed in this encounter.  Referral Orders  No referral(s)  requested today     Note is dictated utilizing voice recognition software. Although note has been proof read prior to signing, occasional typographical errors still can be missed. If any questions arise, please do not hesitate to call for verification.  Electronically signed by: Howard Pouch, DO [place]

## 2023-02-04 ENCOUNTER — Ambulatory Visit (INDEPENDENT_AMBULATORY_CARE_PROVIDER_SITE_OTHER): Payer: BC Managed Care – PPO | Admitting: Nurse Practitioner

## 2023-02-04 ENCOUNTER — Encounter: Payer: Self-pay | Admitting: Nurse Practitioner

## 2023-02-04 VITALS — BP 102/64 | HR 75 | Ht 63.0 in | Wt 139.0 lb

## 2023-02-04 DIAGNOSIS — N951 Menopausal and female climacteric states: Secondary | ICD-10-CM

## 2023-02-04 DIAGNOSIS — Z01419 Encounter for gynecological examination (general) (routine) without abnormal findings: Secondary | ICD-10-CM

## 2023-02-04 NOTE — Progress Notes (Signed)
   Sheri Bray [birth year] [id number]   History:  42 y.o. G2P2002 presents for annual visit. Cycles have become irregular this past year ranging from 30-50 days. Mild hot flashes and night sweats. Multiple family members went through menopause around age 45. 2009 cone biopsy, subsequent paps normal.   Gynecologic History Patient's last menstrual period was 01/27/2023. Period Cycle (Days): 30 (30-39) Period Duration (Days): 4-6 Period Pattern: (!) Irregular Menstrual Flow: Moderate Menstrual Control: Tampon Menstrual Control Change Freq (Hours): 1.5 Dysmenorrhea: (!) Severe Dysmenorrhea Symptoms: Cramping, Diarrhea, Headache, Nausea Contraception/Family planning: none Sexually active: Yes  Health Maintenance Last Pap: 01/23/2021. Results were: Normal neg HPV, 3-year repeat Last mammogram: Not indicated Last colonoscopy: Not indicated Last Dexa: Not indicated  Past medical history, past surgical history, family history and social history were all reviewed and documented in the EPIC chart. Married. 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 12 yo daughter, 41 yo son.  ROS:  A ROS was performed and pertinent positives and negatives are included.  Exam:  Vitals:   02/04/23 0813  BP: 102/64  Pulse: 75  SpO2: 100%  Weight: 139 lb (63 kg)  Height: 5\' 3"  (1.6 m)     Body mass index is 24.62 kg/m.  General appearance:  Normal Thyroid:  Symmetrical, normal in size, without palpable masses or nodularity. Respiratory  Auscultation:  Clear without wheezing or rhonchi Cardiovascular  Auscultation:  Regular rate, without rubs, murmurs or gallops  Edema/varicosities:  Not grossly evident Abdominal  Soft,nontender, without masses, guarding or rebound.  Liver/spleen:  No organomegaly noted  Hernia:  None appreciated  Skin  Inspection:  Grossly normal   Breasts: Examined lying and sitting.   Right: Without masses, retractions, discharge or axillary  adenopathy.   Left: Without masses, retractions, discharge or axillary adenopathy. Genitourinary   Inguinal/mons:  Normal without inguinal adenopathy  External genitalia:  Normal appearing vulva with no masses, tenderness, or lesions  BUS/Urethra/Skene's glands:  Normal  Vagina:  Normal appearing with normal color and discharge, no lesions  Cervix:  Normal appearing without discharge or lesions  Uterus:  Normal in size, shape and contour.  Midline and mobile, nontender  Adnexa/parametria:     Rt: Normal in size, without masses or tenderness.   Lt: Normal in size, without masses or tenderness.  Anus and perineum: Normal  Digital rectal exam: Not indicated  Patient informed chaperone available to be present for breast and pelvic exam. Patient has requested no chaperone to be present. Patient has been advised what will be completed during breast and pelvic exam.   Assessment/Plan:  42 y.o. [id number] for annual exam.   Well female exam with routine gynecological exam - Education provided on SBEs, importance of preventative screenings, current guidelines, high calcium diet, regular exercise, and multivitamin daily. Labs with PCP.  Perimenopause - we discussed perimenopause and what to expect. Recommend Vit E supplement, black cohosh.   Screening for cervical cancer -  2009 cone biopsy, subsequent paps normal. Will repeat at 3-year interval per guidelines.   Screening for cervical cancer - Normal Pap history.  Will repeat at 5-year interval per guidelines.  Screening for breast cancer - Mammogram scheduling in process. Plans to schedule during summer break. Normal breast exam today.    Return in 1 year for annual.     Olivia Mackie DNP, 8:33 AM 02/04/2023

## 2023-04-11 ENCOUNTER — Ambulatory Visit (HOSPITAL_BASED_OUTPATIENT_CLINIC_OR_DEPARTMENT_OTHER)
Admission: RE | Admit: 2023-04-11 | Discharge: 2023-04-11 | Disposition: A | Discharge status: Home / Self Care | Payer: BC Managed Care – PPO | Source: Ambulatory Visit | Attending: Family Medicine | Admitting: Family Medicine

## 2023-04-11 DIAGNOSIS — Z1231 Encounter for screening mammogram for malignant neoplasm of breast: Secondary | ICD-10-CM | POA: Insufficient documentation

## 2023-12-19 ENCOUNTER — Encounter: Payer: Self-pay | Admitting: Family Medicine

## 2023-12-19 ENCOUNTER — Ambulatory Visit (INDEPENDENT_AMBULATORY_CARE_PROVIDER_SITE_OTHER): Payer: 59 | Admitting: Family Medicine

## 2023-12-19 VITALS — BP 108/74 | HR 76 | Temp 98.4°F | Ht 64.5 in | Wt 132.0 lb

## 2023-12-19 DIAGNOSIS — Z13220 Encounter for screening for lipoid disorders: Secondary | ICD-10-CM | POA: Diagnosis not present

## 2023-12-19 DIAGNOSIS — Z793 Long term (current) use of hormonal contraceptives: Secondary | ICD-10-CM | POA: Diagnosis not present

## 2023-12-19 DIAGNOSIS — Z0000 Encounter for general adult medical examination without abnormal findings: Secondary | ICD-10-CM | POA: Diagnosis not present

## 2023-12-19 DIAGNOSIS — Z131 Encounter for screening for diabetes mellitus: Secondary | ICD-10-CM | POA: Diagnosis not present

## 2023-12-19 DIAGNOSIS — Z1231 Encounter for screening mammogram for malignant neoplasm of breast: Secondary | ICD-10-CM | POA: Diagnosis not present

## 2023-12-19 DIAGNOSIS — Z23 Encounter for immunization: Secondary | ICD-10-CM

## 2023-12-19 LAB — CBC
HCT: 41.7 % (ref 36.0–46.0)
Hemoglobin: 13.9 g/dL (ref 12.0–15.0)
MCHC: 33.4 g/dL (ref 30.0–36.0)
MCV: 87.7 fL (ref 78.0–100.0)
Platelets: 353 10*3/uL (ref 150.0–400.0)
RBC: 4.75 Mil/uL (ref 3.87–5.11)
RDW: 12.9 % (ref 11.5–15.5)
WBC: 6.8 10*3/uL (ref 4.0–10.5)

## 2023-12-19 LAB — COMPREHENSIVE METABOLIC PANEL
ALT: 12 U/L (ref 0–35)
AST: 14 U/L (ref 0–37)
Albumin: 4.4 g/dL (ref 3.5–5.2)
Alkaline Phosphatase: 42 U/L (ref 39–117)
BUN: 13 mg/dL (ref 6–23)
CO2: 29 meq/L (ref 19–32)
Calcium: 8.9 mg/dL (ref 8.4–10.5)
Chloride: 102 meq/L (ref 96–112)
Creatinine, Ser: 0.61 mg/dL (ref 0.40–1.20)
GFR: 111.14 mL/min (ref >60.00)
Glucose, Bld: 82 mg/dL (ref 70–99)
Potassium: 4.1 meq/L (ref 3.5–5.1)
Sodium: 136 meq/L (ref 135–145)
Total Bilirubin: 0.6 mg/dL (ref 0.2–1.2)
Total Protein: 6.5 g/dL (ref 6.0–8.3)

## 2023-12-19 LAB — LIPID PANEL
Cholesterol: 146 mg/dL (ref 0–200)
HDL: 53.4 mg/dL (ref >39.00)
LDL Cholesterol: 81 mg/dL (ref 0–99)
NonHDL: 92.56
Total CHOL/HDL Ratio: 3
Triglycerides: 56 mg/dL (ref 0.0–149.0)
VLDL: 11.2 mg/dL (ref 0.0–40.0)

## 2023-12-19 LAB — HEMOGLOBIN A1C: Hgb A1c MFr Bld: 5.2 % (ref 4.6–6.5)

## 2023-12-19 LAB — TSH: TSH: 2.14 u[IU]/mL (ref 0.35–5.50)

## 2023-12-19 NOTE — Progress Notes (Signed)
 Patient ID: Sheri Bray, female  DOB: September 23, 1981, 43 y.o.   MRN: [id number] Patient Care Team    Relationship Specialty Notifications Start End  Natalia Leatherwood, DO PCP - General Family Medicine  08/01/20   Olivia Mackie, NP Nurse Practitioner Gynecology  12/14/21     Chief Complaint  Patient presents with   Annual Exam    Pt is fasting.  Flu vax: oct. 2024    Subjective:  Sheri Bray is a 43 y.o. female present for CPE. All past medical history, surgical history, allergies, family history, immunizations, medications and social history were updated in the electronic medical record today. All recent labs, ED visits and hospitalizations within the last year were reviewed.  Health maintenance:  Colon cancer : no fhx. Start screen at 46. Mammogram: no fhx, completed 04/11/2023 [facility].  Ordered for 2025 Cervical cancer screening: last pap: 01/23/2021, 5 yr. > GYN T. Sheri Plater, NP Immunizations: tdap UTD 2017, Influenza UTD 2024 (encouraged yearly) Infectious disease screening: HIV completed , Hep C completed   DEXA: routine screen to start at age 78-60 Assistive device: none Oxygen [id number] Patient has a Dental home. Hospitalizations/ED visits: Reviewed     12/16/2022    8:12 AM 12/14/2021    8:11 AM 12/13/2020    9:30 AM 08/01/2020   10:35 AM  Depression screen PHQ 2/9  Decreased Interest 0 0 0 0  Down, Depressed, Hopeless 0 0 0 1  PHQ - 2 Score 0 0 0 1  Altered sleeping    3  Tired, decreased energy    1  Change in appetite    1  Feeling bad or failure about yourself     2  Trouble concentrating    0  Moving slowly or fidgety/restless    2  Suicidal thoughts    0  PHQ-9 Score    10      12/16/2022    8:12 AM 08/01/2020   10:35 AM  GAD 7 : Generalized Anxiety Score  Nervous, Anxious, on Edge 2 2  Control/stop worrying 3 1  Worry too much - different things 3 2  Trouble relaxing 3 1  Restless 0 1  Easily annoyed or irritable 3 3  Afraid - awful might  happen 3 3  Total GAD 7 Score 17 13           No data to display          Immunization History  Administered Date(s) Administered   DTaP 10/12/1982, 02/04/1983, 09/13/1983, 10/01/1984, 04/09/1988   IPV 10/12/1982, 02/04/1983, 09/13/1983, 10/02/1983   Influenza Inj Mdck Quad Pf 08/15/2018   Influenza Split 08/16/2011   Influenza,inj,Quad PF,6+ Mos 08/01/2020, 07/28/2022   Influenza-Unspecified 06/28/2013, 07/19/2016, 07/28/2021, 08/04/2023   MMR 10/01/1984, 06/19/2020   Moderna Sars-Covid-2 Vaccination 01/13/2020, 02/08/2020, 08/31/2020   PPD Test 05/21/2021   Pfizer Covid-19 Vaccine Bivalent Booster 65yrs & up 09/12/2021   Td 05/31/2001   Tdap 04/22/2011, 04/26/2014, 06/21/2016     Past Medical History:  Diagnosis Date   Blood type A-    Chicken pox    COVID-19 10/2020   mild symptoms   Eruption cyst 08/01/2020   GERD (gastroesophageal reflux disease)    Hay fever    HPV in female 2009   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   Allergies  Allergen Reactions   Milk-Related Compounds Nausea Only   Sulfamethoxazole Rash   Past Surgical History:  Procedure Laterality Date   CERVICAL  CONE BIOPSY  2009   LAPAROSCOPY     MOUTH SURGERY     TONSILLECTOMY  2001   Family History  Problem Relation Age of Onset   Arthritis Mother    Asthma Mother    Depression Mother        Frontotemporal lobe dementia   Diabetes Mother    Thyroid disease Mother    Hearing loss Mother    Hypertension Father    Hyperlipidemia Father    Depression Maternal Grandmother    Stroke Maternal Grandmother    Asthma Maternal Grandmother    COPD Maternal Grandmother    Lymphoma Maternal Grandfather    Diabetes Paternal Grandmother    Cancer Paternal Grandfather    Leukemia Paternal Grandfather    Hypertension Brother    Hyperlipidemia Brother    Social History   Social History Narrative   Marital status/children/pets: Married.   Education/employment: Bachelor's  degree. Employed as a travel Art therapist:      -Wears a bicycle helmet riding a bike: Yes     -smoke alarm in the home:Yes     - wears seatbelt: Yes     - Feels safe in their relationships: Yes    Allergies as of 12/19/2023       Reactions   Milk-related Compounds Nausea Only   Sulfamethoxazole Rash        Medication List        Accurate as of December 19, 2023  8:13 AM. If you have any questions, ask your nurse or doctor.          STOP taking these medications    b complex vitamins capsule Stopped by: Felix Pacini       TAKE these medications    drospirenone-ethinyl estradiol 3-0.02 MG tablet Commonly known as: YAZ Take 1 tablet by mouth daily. [place]   fluticasone 50 MCG/ACT nasal spray Commonly known as: FLONASE Place 2 sprays into both nostrils at bedtime.   Krill Oil 500 MG Caps Take by mouth.   levocetirizine 5 MG tablet Commonly known as: XYZAL Take 5 mg by mouth every evening.   MAG GLYCINATE PO Take 250 mg by mouth.   VITAMIN D PO Take 1,000 Units by mouth.       All past medical history, surgical history, allergies, family history, immunizations andmedications were updated in the EMR today and reviewed under the history and medication portions of their EMR.     No results found for this or any previous visit (from the past 2160 hours).  No results found.  ROS 14 pt review of systems performed and negative (unless mentioned in an HPI)  Objective: BP 108/74   Pulse 76   Temp 98.4 F (36.9 C)   Ht 5' 4.5" (1.638 m)   Wt 132 lb (59.9 kg)   LMP 11/15/2023   SpO2 98%   BMI 22.31 kg/m  Physical Exam Vitals and nursing note reviewed.  Constitutional:      General: She is not in acute distress.    Appearance: Normal appearance. She is not ill-appearing or toxic-appearing.  HENT:     Head: Normocephalic and atraumatic.     Right Ear: Tympanic membrane, ear canal and external ear normal. There is no impacted cerumen.     Left  Ear: Tympanic membrane, ear canal and external ear normal. There is no impacted cerumen.     Nose: No congestion or rhinorrhea.     Mouth/Throat:     Mouth:  Mucous membranes are moist.     Pharynx: Oropharynx is clear. No oropharyngeal exudate or posterior oropharyngeal erythema.  Eyes:     General: No scleral icterus.       Right eye: No discharge.        Left eye: No discharge.     Extraocular Movements: Extraocular movements intact.     Conjunctiva/sclera: Conjunctivae normal.     Pupils: Pupils are equal, round, and reactive to light.  Cardiovascular:     Rate and Rhythm: Normal rate and regular rhythm.     Pulses: Normal pulses.     Heart sounds: Normal heart sounds. No murmur heard.    No friction rub. No gallop.  Pulmonary:     Effort: Pulmonary effort is normal. No respiratory distress.     Breath sounds: Normal breath sounds. No stridor. No wheezing, rhonchi or rales.  Chest:     Chest wall: No tenderness.  Abdominal:     General: Abdomen is flat. Bowel sounds are normal. There is no distension.     Palpations: Abdomen is soft. There is no mass.     Tenderness: There is no abdominal tenderness. There is no right CVA tenderness, left CVA tenderness, guarding or rebound.     Hernia: No hernia is present.  Musculoskeletal:        General: No swelling, tenderness or deformity. Normal range of motion.     Cervical back: Normal range of motion and neck supple. No rigidity or tenderness.     Right lower leg: No edema.     Left lower leg: No edema.  Lymphadenopathy:     Cervical: No cervical adenopathy.  Skin:    General: Skin is warm and dry.     Coloration: Skin is not jaundiced or pale.     Findings: No bruising, erythema, lesion or rash.  Neurological:     General: No focal deficit present.     Mental Status: She is alert and oriented to person, place, and time. Mental status is at baseline.     Cranial Nerves: No cranial nerve deficit.     Sensory: No sensory deficit.      Motor: No weakness.     Coordination: Coordination normal.     Gait: Gait normal.     Deep Tendon Reflexes: Reflexes normal.  Psychiatric:        Mood and Affect: Mood normal.        Behavior: Behavior normal.        Thought Content: Thought content normal.        Judgment: Judgment normal.    No results found.  Assessment/plan: Sheri Bray is a 43 y.o. female present for CPE Routine general medical examination at a health care facility Patient was encouraged to exercise greater than 150 minutes a week. Patient was encouraged to choose a diet filled with fresh fruits and vegetables, and lean meats. AVS provided to patient today for education/recommendation on gender specific health and safety maintenance. Colon cancer : no fhx. Start screen at 46. Mammogram: no fhx, completed [facility]/11/2023 [facility].  Ordered for 2025 Cervical cancer screening: last pap: 01/23/2021, 5 yr. > GYN T. Sheri Plater, NP Immunizations: tdap UTD 2017, Influenza UTD 2024 (encouraged yearly) Infectious disease screening: HIV completed , Hep C completed   DEXA: routine screen to start at age 8-60  Return in about 1 year (around 12/19/2024) for cpe (20 min).   Orders Placed This Encounter  Procedures   MM 3D SCREENING  MAMMOGRAM BILATERAL BREAST   CBC   Comprehensive metabolic panel   Hemoglobin A1c   Lipid panel   TSH   No orders of the defined types were placed in this encounter.  Referral Orders  No referral(s) requested today     Note is dictated utilizing voice recognition software. Although note has been proof read prior to signing, occasional typographical errors still can be missed. If any questions arise, please do not hesitate to call for verification.  Electronically signed by: Felix Pacini, DO [facility] [place]

## 2023-12-19 NOTE — Patient Instructions (Addendum)
 Return in about 1 year (around 12/19/2024) for cpe (20 min).        Great to see you today.  I have refilled the medication(s) we provide.   If labs were collected or images ordered, we will inform you of  results once we have received them and reviewed. We will contact you either by echart message, or telephone call.  Please give ample time to the testing facility, and our office to run,  receive and review results. Please do not call inquiring of results, even if you can see them in your chart. We will contact you as soon as we are able. If it has been over 1 week since the test was completed, and you have not yet heard from [place], then please call us.    - echart message- for normal results that have been seen by the patient already.   - telephone call: abnormal results or if patient has not viewed results in their echart.  If a referral to a specialist was entered for you, please call us in 2 weeks if you have not heard from the specialist office to schedule.

## 2023-12-22 ENCOUNTER — Encounter: Payer: Self-pay | Admitting: Family Medicine

## 2024-05-10 ENCOUNTER — Ambulatory Visit (HOSPITAL_BASED_OUTPATIENT_CLINIC_OR_DEPARTMENT_OTHER)
Admission: RE | Admit: 2024-05-10 | Discharge: 2024-05-10 | Disposition: A | Discharge status: Home / Self Care | Source: Ambulatory Visit | Attending: Family Medicine | Admitting: Family Medicine

## 2024-05-10 ENCOUNTER — Encounter (HOSPITAL_BASED_OUTPATIENT_CLINIC_OR_DEPARTMENT_OTHER): Payer: Self-pay | Admitting: Radiology

## 2024-05-10 DIAGNOSIS — Z1231 Encounter for screening mammogram for malignant neoplasm of breast: Secondary | ICD-10-CM | POA: Diagnosis present

## 2024-05-12 ENCOUNTER — Ambulatory Visit: Admitting: Family Medicine

## 2024-05-12 ENCOUNTER — Encounter: Payer: Self-pay | Admitting: Family Medicine

## 2024-05-12 VITALS — BP 108/75 | HR 60 | Temp 98.3°F | Wt 136.6 lb

## 2024-05-12 DIAGNOSIS — K219 Gastro-esophageal reflux disease without esophagitis: Secondary | ICD-10-CM | POA: Diagnosis not present

## 2024-05-12 MED ORDER — PANTOPRAZOLE SODIUM 40 MG PO TBEC: 40.0000 mg | DELAYED_RELEASE_TABLET | Freq: Every day | ORAL | 5 refills | Status: DC

## 2024-05-12 NOTE — Progress Notes (Unsigned)
 Sheri Bray , 07-Jun-1981, 43 y.o., female MRN: [id number] Patient Care Team    Relationship Specialty Notifications Start End  Catherine Charlies LABOR, DO PCP - General Family Medicine  08/01/20   [name] LABOR, NP Nurse Practitioner Gynecology  12/14/21     Chief Complaint  Patient presents with   GI Problem    A few months; upper abdominal pain, constant burping, nausea, bloating. Pt has been prescribed something for GERD before and found relief.     Subjective: Sheri Bray is a 43 y.o. Pt presents for an OV with complaints of 2 months of epigastric discomfort.  Associated symptoms include increased belching, nausea and bloating.  Patient reports the discomfort can be worse after eating and she can have pain at that time.  When she is running she will have discomfort underneath her bra line.  She has changed her diet to attempt to decrease the symptoms.  She definitely notices there are certain food triggers for her. Patient reports she had similar symptoms in the past and was prescribed Nexium which helped resolve her issues. She denies any unintentional weight loss or vomiting.      05/12/2024    9:58 AM 12/19/2023   10:07 AM 12/16/2022    8:12 AM 12/14/2021    8:11 AM 12/13/2020    9:30 AM  Depression screen PHQ 2/9  Decreased Interest 0 0 0 0 0  Down, Depressed, Hopeless 1 0 0 0 0  PHQ - 2 Score 1 0 0 0 0  Altered sleeping 2      Tired, decreased energy 1      Change in appetite 1      Feeling bad or failure about yourself  0      Trouble concentrating 1      Moving slowly or fidgety/restless 0      Suicidal thoughts 0      PHQ-9 Score 6      Difficult doing work/chores Not difficult at all        Allergies  Allergen Reactions   Milk-Related Compounds Nausea Only   Sulfamethoxazole Rash   Social History   Social History Narrative   Marital status/children/pets: Married.   Education/employment: Bachelor's degree. 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   Past Surgical History:  Procedure Laterality Date   CERVICAL CONE BIOPSY  2009   LAPAROSCOPY     MOUTH SURGERY     TONSILLECTOMY  2001   Family History  Problem Relation Age of Onset   Arthritis Mother    Asthma Mother    Depression Mother        Frontotemporal lobe dementia   Diabetes Mother    Thyroid  disease Mother    Hearing loss Mother    Hypertension Father    Hyperlipidemia Father    Depression Maternal Grandmother    Stroke Maternal Grandmother  Asthma Maternal Grandmother    COPD Maternal Grandmother    Lymphoma Maternal Grandfather    Diabetes Paternal Grandmother    Cancer Paternal Grandfather    Leukemia Paternal Grandfather    Hypertension Brother    Hyperlipidemia Brother    Allergies as of 05/12/2024       Reactions   Milk-related Compounds Nausea Only   Sulfamethoxazole Rash        Medication List        Accurate as of May 12, 2024 11:59 PM. If you have any questions, ask your nurse or doctor.          drospirenone-ethinyl estradiol 3-0.02 MG tablet Commonly known as: YAZ Take 1 tablet by mouth daily. Loryna   fluticasone  50 MCG/ACT nasal spray Commonly known as: FLONASE  Place 2 sprays into both nostrils at bedtime.   Krill Oil 500 MG Caps Take by mouth.   levocetirizine 5 MG tablet Commonly known as: XYZAL Take 5 mg by mouth every evening.   MAG GLYCINATE PO Take 250 mg by mouth.   pantoprazole  40 MG tablet Commonly known as: PROTONIX  Take 1 tablet (40 mg total) by mouth daily. Started by: Reighn Kaplan   VITAMIN D  PO Take 1,000 Units by mouth.        All past medical history, surgical history, allergies, family history, immunizations andmedications were updated in the EMR today and reviewed under the history and medication portions of their EMR.     ROS Negative, with the exception of above mentioned in HPI   Objective:  BP 108/75   Pulse 60   Temp 98.3 F (36.8 C)   Wt 136 lb 9.6 oz (62 kg)   LMP 04/18/2024   SpO2 98%   BMI 23.09 kg/m  Body mass index is 23.09 kg/m. Physical Exam Vitals and nursing note reviewed.  Constitutional:      General: She is not in acute distress.    Appearance: Normal appearance. She is not ill-appearing, toxic-appearing or diaphoretic.     Comments: Belching present  HENT:     Head: Normocephalic and atraumatic.     Mouth/Throat:     Mouth: Mucous membranes are moist.     Pharynx: No oropharyngeal exudate or posterior oropharyngeal erythema.  Eyes:     General: No scleral icterus.       Right eye: No discharge.        Left eye: No discharge.     Extraocular Movements: Extraocular movements intact.     Conjunctiva/sclera: Conjunctivae normal.     Pupils: Pupils are equal, round, and reactive to light.  Cardiovascular:     Rate and Rhythm: Normal rate and regular rhythm.  Pulmonary:     Effort: Pulmonary effort is normal. No respiratory distress.     Breath sounds: Normal breath sounds. No wheezing, rhonchi or rales.  Abdominal:     General: Abdomen is flat. There is no distension.     Palpations: Abdomen is soft. There is no mass.     Tenderness: There is abdominal tenderness (Mild epigastric tenderness). There is no right CVA tenderness, left CVA tenderness, guarding or rebound.  Musculoskeletal:     Right lower leg: No edema.     Left lower leg: No edema.  Skin:    General: Skin is warm.     Findings: No rash.  Neurological:     Mental Status: She is alert and oriented to person, place, and time. Mental status is at  baseline.  Motor: No weakness.     Gait: Gait normal.  Psychiatric:        Mood and Affect: Mood normal.        Behavior: Behavior normal.        Thought Content: Thought content normal.        Judgment: Judgment normal.      No results found. No results found. No results found for this or any previous visit (from the past 24 hours).  Assessment/Plan: Sheri Bray is a 43 y.o. female present for OV for  Gastroesophageal reflux disease without esophagitis (Primary) Discussed food triggers and what to avoid Start Protonix  40 mg daily AVS on GERD diet provided Patient encouraged to start Protonix  and if not seeing any improvement in symptoms in 3 to 4 weeks to call back in and make an appointment we would have to do further testing including H. pylori testing. If she is seeing improvement after starting the Protonix  she is to take for 3 months, then can start tapering back if able.  Reviewed expectations re: course of current medical issues. Discussed self-management of symptoms. Outlined signs and symptoms indicating need for more acute intervention. Patient verbalized understanding and all questions were answered. Patient received an After-Visit Summary.    No orders of the defined types were placed in this encounter.  Meds ordered this encounter  Medications   pantoprazole  (PROTONIX ) 40 MG tablet    Sig: Take 1 tablet (40 mg total) by mouth daily.    Dispense:  30 tablet    Refill:  5   Referral Orders  No referral(s) requested today     Note is dictated utilizing voice recognition software. Although note has been proof read prior to signing, occasional typographical errors still can be missed. If any questions arise, please do not hesitate to call for verification.   electronically signed by:  Charlies Bellini, DO  [facility] [place]

## 2024-05-12 NOTE — Patient Instructions (Signed)

## 2024-05-14 ENCOUNTER — Other Ambulatory Visit: Payer: Self-pay | Admitting: Family Medicine

## 2024-05-14 ENCOUNTER — Encounter: Payer: Self-pay | Admitting: Family Medicine

## 2024-05-14 DIAGNOSIS — R928 Other abnormal and inconclusive findings on diagnostic imaging of breast: Secondary | ICD-10-CM

## 2024-05-19 ENCOUNTER — Ambulatory Visit: Admitting: Family Medicine

## 2024-05-28 ENCOUNTER — Ambulatory Visit

## 2024-05-28 ENCOUNTER — Ambulatory Visit
Admission: RE | Admit: 2024-05-28 | Discharge: 2024-05-28 | Disposition: A | Discharge status: Home / Self Care | Source: Ambulatory Visit | Attending: Family Medicine | Admitting: Family Medicine

## 2024-05-28 DIAGNOSIS — R928 Other abnormal and inconclusive findings on diagnostic imaging of breast: Secondary | ICD-10-CM

## 2024-08-06 ENCOUNTER — Ambulatory Visit (INDEPENDENT_AMBULATORY_CARE_PROVIDER_SITE_OTHER)

## 2024-08-06 DIAGNOSIS — Z23 Encounter for immunization: Secondary | ICD-10-CM | POA: Diagnosis not present

## 2024-08-06 NOTE — Progress Notes (Signed)
 Pt in for regular dose flu vaccine per Dr. Catherine  Injection tolerated well.  Vaccine handout given to pt.

## 2024-10-08 ENCOUNTER — Ambulatory Visit: Admitting: Family Medicine

## 2024-10-08 ENCOUNTER — Encounter: Payer: Self-pay | Admitting: Family Medicine

## 2024-10-08 VITALS — BP 106/72 | HR 75 | Temp 98.1°F | Wt 141.8 lb

## 2024-10-08 DIAGNOSIS — K219 Gastro-esophageal reflux disease without esophagitis: Secondary | ICD-10-CM

## 2024-10-08 DIAGNOSIS — R1013 Epigastric pain: Secondary | ICD-10-CM

## 2024-10-08 MED ORDER — SUCRALFATE 1 G PO TABS: 1.0000 g | ORAL_TABLET | Freq: Three times a day (TID) | ORAL | 0 refills | Status: DC

## 2024-10-08 MED ORDER — PANTOPRAZOLE SODIUM 40 MG PO TBEC: 40.0000 mg | DELAYED_RELEASE_TABLET | Freq: Every day | ORAL | 1 refills | Status: AC

## 2024-10-08 NOTE — Progress Notes (Signed)
 Sheri Bray , 26-Feb-1981, 43 y.o., female MRN: [id number] Patient Care Team    Relationship Specialty Notifications Start End  Catherine Charlies LABOR, DO PCP - General Family Medicine  08/01/20   [name] LABOR, NP Nurse Practitioner Gynecology  12/14/21     Chief Complaint  Patient presents with   Gastroesophageal Reflux     Subjective: Sheri Bray is a 43 y.o. Pt presents for an OV with complaints of worsening reflux symptoms. Medication reconciliation completed today Past medical history updated if appropriate changes  GERD: Patient reports at the end of November she was carbohydrate loading for half marathon preparation.  She reports the night before the half marathon her stomach felt so bloated and uncomfortable she vomited.  She reports at that same time she had a confrontation at work which caused her a great deal of stress and she worried for 3 days straight.  During this time her stomach felt like it was burning. Since that time she has returned to her normal diet now that she has completed the half marathon.  In the increased anxiety secondary to a work-related issue has resolved.  She reports today she is not having any issues in her stomach and she is continuing the Protonix  daily. Prior note: 2 months of epigastric discomfort.  Associated symptoms include increased belching, nausea and bloating.  Patient reports the discomfort can be worse after eating and she can have pain at that time.  When she is running she will have discomfort underneath her bra line.  She has changed her diet to attempt to decrease the symptoms.  She definitely notices there are certain food triggers for her. Patient reports she had similar symptoms in the past and was prescribed Nexium which helped resolve her issues. She denies any unintentional weight loss or vomiting.      10/08/2024    9:41 AM 05/12/2024    9:58 AM 12/19/2023   10:07 AM 12/16/2022    8:12 AM 12/14/2021    8:11 AM   Depression screen PHQ 2/9  Decreased Interest 0 0 0 0 0  Down, Depressed, Hopeless 0 1 0 0 0  PHQ - 2 Score 0 1 0 0 0  Altered sleeping 0 2     Tired, decreased energy 0 1     Change in appetite 0 1     Feeling bad or failure about yourself  0 0     Trouble concentrating 0 1     Moving slowly or fidgety/restless 0 0     Suicidal thoughts 0 0     PHQ-9 Score 0 6      Difficult doing work/chores Not difficult at all Not difficult at all        Data saved with a previous flowsheet row definition    Allergies  Allergen Reactions   Milk-Related Compounds Nausea Only   Sulfamethoxazole Rash   Social History   Social History Narrative   Marital status/children/pets: Married.   Education/employment: Bachelor's degree. 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   Past Surgical History:  Procedure Laterality Date   CERVICAL CONE BIOPSY  2009   LAPAROSCOPY     MOUTH SURGERY     TONSILLECTOMY  2001   Family History  Problem Relation Age of Onset   Arthritis Mother    Asthma Mother    Depression Mother        Frontotemporal lobe dementia   Diabetes Mother    Thyroid  disease Mother    Hearing loss Mother    Hypertension Father    Hyperlipidemia Father    Depression Maternal Grandmother    Stroke Maternal Grandmother    Asthma Maternal Grandmother    COPD Maternal Grandmother    Lymphoma Maternal Grandfather    Diabetes Paternal Grandmother    Cancer Paternal Grandfather    Leukemia Paternal Grandfather    Hypertension Brother    Hyperlipidemia  Brother    Allergies as of 10/08/2024       Reactions   Milk-related Compounds Nausea Only   Sulfamethoxazole Rash        Medication List        Accurate as of October 08, 2024  9:58 AM. If you have any questions, ask your nurse or doctor.          STOP taking these medications    drospirenone-ethinyl estradiol 3-0.02 MG tablet Commonly known as: YAZ Stopped by: Charlies Bellini, DO   Krill Oil 500 MG Caps Stopped by: Charlies Bellini, DO   VITAMIN D  PO Stopped by: Charlies Bellini, DO       TAKE these medications    estradiol 0.025 MG/24HR Commonly known as: VIVELLE-DOT Place 1 patch onto the skin 2 (two) times a week.   fluticasone  50 MCG/ACT nasal spray Commonly known as: FLONASE  Place 2 sprays into both nostrils at bedtime.   levocetirizine 5 MG tablet Commonly known as: XYZAL Take 5 mg by mouth every evening.   MAG GLYCINATE PO Take 250 mg by mouth.   pantoprazole  40 MG tablet Commonly known as: PROTONIX  Take 1 tablet (40 mg total) by mouth daily.   progesterone 100 MG capsule Commonly known as: PROMETRIUM Take 100 mg by mouth at bedtime.   sucralfate 1 g tablet Commonly known as: Carafate Take 1 tablet (1 g total) by mouth 4 (four) times daily -  with meals and at bedtime. Started by: Charlies Bellini, DO        All past medical history, surgical history, allergies, family history, immunizations andmedications were updated in the EMR today and reviewed under the history and medication portions of their EMR.     ROS Negative, with the exception of above mentioned in HPI   Objective:  BP 106/72   Pulse 75   Temp 98.1 F (36.7 C)   Wt 141 lb 12.8 oz (64.3 kg)   LMP 08/30/2024   SpO2 98%   BMI 23.96 kg/m  Body mass index is 23.96 kg/m. Physical Exam Vitals and nursing note reviewed.  Constitutional:      General: She is not in acute distress.    Appearance: Normal appearance. She is normal weight. She is not ill-appearing or  toxic-appearing.  HENT:     Head: Normocephalic and atraumatic.  Eyes:     General: No scleral icterus.       Right eye: No discharge.  Left eye: No discharge.     Extraocular Movements: Extraocular movements intact.     Conjunctiva/sclera: Conjunctivae normal.     Pupils: Pupils are equal, round, and reactive to light.  Abdominal:     General: Abdomen is flat. Bowel sounds are normal. There is no distension.     Palpations: Abdomen is soft. There is no mass.     Tenderness: There is abdominal tenderness (mild epigastric TTP). There is no right CVA tenderness, left CVA tenderness, guarding or rebound.     Hernia: No hernia is present.  Skin:    Findings: No rash.  Neurological:     Mental Status: She is alert and oriented to person, place, and time. Mental status is at baseline.     Motor: No weakness.     Coordination: Coordination normal.     Gait: Gait normal.  Psychiatric:        Mood and Affect: Mood normal.        Behavior: Behavior normal.        Thought Content: Thought content normal.        Judgment: Judgment normal.     No results found. No results found. No results found for this or any previous visit (from the past 24 hours).  Assessment/Plan: ALIYA SOL is a 43 y.o. female present for OV for  Gastroesophageal reflux disease without esophagitis (Primary) Increase sx after carb loading and goinf through a stressful few days. Symptoms are resolving, but she is TTP epigastric on exam.  Continue Protonix  40 mg daily Start carafate before meals and before bed for 2-4 weeks depending on symptoms.  Avoid food triggers- she is back to her normal diet now.  F/u 1 mo if symptoms are not improving, sooner if worsening  Reviewed expectations re: course of current medical issues. Discussed self-management of symptoms. Outlined signs and symptoms indicating need for more acute intervention. Patient verbalized understanding and all questions were  answered. Patient received an After-Visit Summary.    No orders of the defined types were placed in this encounter.  Meds ordered this encounter  Medications   pantoprazole  (PROTONIX ) 40 MG tablet    Sig: Take 1 tablet (40 mg total) by mouth daily.    Dispense:  90 tablet    Refill:  1   sucralfate (CARAFATE) 1 g tablet    Sig: Take 1 tablet (1 g total) by mouth 4 (four) times daily -  with meals and at bedtime.    Dispense:  120 tablet    Refill:  0   Referral Orders  No referral(s) requested today     Note is dictated utilizing voice recognition software. Although note has been proof read prior to signing, occasional typographical errors still can be missed. If any questions arise, please do not hesitate to call for verification.   electronically signed by:  Charlies Bellini, DO  [facility] [place]

## 2024-10-08 NOTE — Patient Instructions (Addendum)

## 2024-11-09 ENCOUNTER — Other Ambulatory Visit: Payer: Self-pay | Admitting: Family Medicine

## 2024-12-20 ENCOUNTER — Encounter: Payer: 59 | Admitting: Family Medicine
